# Patient Record
Sex: Female | Born: 1985
Health system: Southern US, Community
[De-identification: ages and names within clinical notes are randomized; demographics above are authoritative.]

## PROBLEM LIST (undated history)

## (undated) DIAGNOSIS — A749 Chlamydial infection, unspecified: Secondary | ICD-10-CM

## (undated) DIAGNOSIS — Z789 Other specified health status: Secondary | ICD-10-CM

## (undated) DIAGNOSIS — O98819 Other maternal infectious and parasitic diseases complicating pregnancy, unspecified trimester: Secondary | ICD-10-CM

## (undated) HISTORY — PX: HERNIA REPAIR: SHX51

## (undated) HISTORY — DX: Other specified health status: Z78.9

---

## 2019-12-27 NOTE — L&D Delivery Note (Addendum)
Krystal Figueroa is a 34 y.o. s/p vaginal delivery at [redacted]w[redacted]d.  She was admitted for IOL secondary to newly diagnosed gHTN and several late decelerations on fetal heart tracing in MAU.    ROM: 19h 65m with clear fluid GBS Status: negative Maximum Maternal Temperature: 102.79F   Labor Progress: Pt in latent labor on admission.  She continued to progress well.  AROM was performed for clear fluid. Pitocin was also initiated, and pt then was noted to have complete cervical dilation. She then delivered. Prior to delivery, patient noted to have temperature of 102.3; ampicillin and gentamycin were administered for empiric treatment of chorioamnionitis prior to delivery.   Delivery Date/Time: 12/5/2021at 0254 Delivery: Called to room and patient was complete and pushing. Given deep prolonged decelerations with contractions, pt was consented for vacuum-assisted vaginal delivery, but she was able to delivery without assistance prior to time that vacuum extractor was made available. Head delivered LOA. Tight nuchal cord x1 present. Shoulder and body delivered in usual fashion. Infant with spontaneous cry, placed on mother's abdomen, dried and stimulated. Cord clamped x 2 immediately, and cut by delivering physician given initial fetal assessment. Cord blood drawn along with arterial blood gas (pH 7.17). Placenta delivered spontaneously with gentle cord traction. Fundus firm with massage and Pitocin. Labia, perineum, vagina, and cervix were inspected; 2nd degree laceration visualized and repaired. Infant was prompted placed on maternal chest s/p brief period of nursing evaluation.    Placenta: intact, 3-vessel cord, sent to L&D, blood gas obtained Complications: chorioamnionitis  Lacerations: 2nd degree laceration with repair  EBL: 100 ml Analgesia: epidural   Infant: female  APGARs 7 & 9  weight 3147g  Randa Ngo, MD OB Fellow, Faculty Practice 11/29/2020 10:48 AM

## 2020-06-18 ENCOUNTER — Other Ambulatory Visit: Payer: Self-pay

## 2020-06-18 ENCOUNTER — Other Ambulatory Visit: Payer: Self-pay | Admitting: Nurse Practitioner

## 2020-06-18 DIAGNOSIS — Z36 Encounter for antenatal screening for chromosomal anomalies: Secondary | ICD-10-CM

## 2020-06-18 LAB — OB RESULTS CONSOLE HEPATITIS B SURFACE ANTIGEN: Hepatitis B Surface Ag: NEGATIVE

## 2020-06-18 LAB — OB RESULTS CONSOLE RPR: RPR: NONREACTIVE

## 2020-06-18 LAB — OB RESULTS CONSOLE RUBELLA ANTIBODY, IGM: Rubella: IMMUNE

## 2020-06-18 LAB — OB RESULTS CONSOLE GC/CHLAMYDIA
Chlamydia: POSITIVE
Gonorrhea: NEGATIVE

## 2020-06-18 LAB — HEPATITIS C ANTIBODY: HCV Ab: NEGATIVE

## 2020-06-18 LAB — OB RESULTS CONSOLE HIV ANTIBODY (ROUTINE TESTING): HIV: NONREACTIVE

## 2020-07-02 ENCOUNTER — Ambulatory Visit: Payer: Self-pay | Attending: Obstetrics and Gynecology

## 2020-07-02 ENCOUNTER — Ambulatory Visit: Payer: Self-pay | Admitting: *Deleted

## 2020-07-02 ENCOUNTER — Other Ambulatory Visit: Payer: Self-pay

## 2020-07-02 ENCOUNTER — Other Ambulatory Visit: Payer: Self-pay | Admitting: *Deleted

## 2020-07-02 ENCOUNTER — Ambulatory Visit: Payer: Self-pay | Admitting: Genetic Counselor

## 2020-07-02 ENCOUNTER — Ambulatory Visit (HOSPITAL_BASED_OUTPATIENT_CLINIC_OR_DEPARTMENT_OTHER): Payer: Self-pay | Admitting: Genetic Counselor

## 2020-07-02 VITALS — BP 113/73 | HR 93 | Wt 153.0 lb

## 2020-07-02 DIAGNOSIS — Z36 Encounter for antenatal screening for chromosomal anomalies: Secondary | ICD-10-CM | POA: Insufficient documentation

## 2020-07-02 DIAGNOSIS — Z3A19 19 weeks gestation of pregnancy: Secondary | ICD-10-CM

## 2020-07-02 DIAGNOSIS — Z362 Encounter for other antenatal screening follow-up: Secondary | ICD-10-CM

## 2020-07-02 DIAGNOSIS — Z820 Family history of epilepsy and other diseases of the nervous system: Secondary | ICD-10-CM

## 2020-07-02 DIAGNOSIS — Z315 Encounter for genetic counseling: Secondary | ICD-10-CM

## 2020-07-02 DIAGNOSIS — O99212 Obesity complicating pregnancy, second trimester: Secondary | ICD-10-CM

## 2020-07-02 DIAGNOSIS — E669 Obesity, unspecified: Secondary | ICD-10-CM

## 2020-07-02 DIAGNOSIS — Z363 Encounter for antenatal screening for malformations: Secondary | ICD-10-CM

## 2020-07-02 DIAGNOSIS — O0990 Supervision of high risk pregnancy, unspecified, unspecified trimester: Secondary | ICD-10-CM

## 2020-07-02 DIAGNOSIS — Z8279 Family history of other congenital malformations, deformations and chromosomal abnormalities: Secondary | ICD-10-CM

## 2020-07-02 NOTE — Progress Notes (Addendum)
07/02/2020  Krystal Figueroa 08/27/1986 MRN: [id number] DOV: 07/02/2020  [name] presented to the [facility] for a genetics consultation regarding her family history of a neurodevelopmental disorder. [name] presented to her appointment alone. This session was facilitated by [facility].   Indication for genetic counseling - Family history of neurodevelopmental disorder  Prenatal history  [name] is a G3P0, 34 y.o. female. Her current pregnancy has completed [redacted]w[redacted]d (Estimated Date of Delivery: 11/26/20).  [name] denied exposure to environmental toxins or chemical agents. She denied the use of alcohol, tobacco or street drugs. She reported taking prenatal vitamins. She denied significant viral illnesses, fevers, and bleeding during the course of her pregnancy. Her medical and surgical histories were noncontributory.  Family History  A three generation pedigree was drafted and reviewed. The family history is remarkable for the following:  - [name] has a 20 year old sister who she describes as "special". She is able to walk/run independently at home and can speak but is difficult to understand. She was never able to attend school and lives at home with [name]'s parents. She also has not had a menstrual period in quite some time. She does not have any other medical problems. [name] reported that her sister looks like the rest of her family aside from having one pupil that is smaller than the other. When asked about what she believes caused her sister's condition, [name] reported that her mother had gastritis during pregnancy and took some unknown medications that made her feel unwell. Additionally, [name] informed me that her sister was born at the time of an eclipse, and that they believe this contributed to her features.  - Ms.  Krystal Figueroa has a maternal uncle who had a son who died at age 82. He also had a neurodevelopmental disorder of some kind; however, he was never able to walk, talk, or feed himself. [name] reported that he died from "fluid in his blood." She did not have any further details about him. She informed me that this cousin was born around the same time as her sister.  The remaining family histories were reviewed and found to be noncontributory for birth defects, intellectual disability, recurrent pregnancy loss, and known genetic conditions.    The patient's ethnicity is [place]. The father of the pregnancy's ethnicity is [place]. Ashkenazi Jewish ancestry and consanguinity were denied. Pedigree will be scanned under Media.  Discussion  Family history of neurodevelopmental disorder:  [name] was referred for genetic counseling due to her family history of an unknown neurodevelopmental disorder. Both [name]'s sister and maternal first cousin have a history of neurodevelopmental deficits. Neither of them have undergone genetic testing. [name]'s family believes that their features are related to an eclipse that was occurring at the time of birth for both individuals.   We discussed that developmental delays can be an underlying feature of many different genetic conditions with varying patterns of inheritance. However, developmental disorders can also be multifactorial in nature, occurring due to a combination of genetic and environmental factors that can be difficult to identify. A possible pattern of inheritance is unclear based on [name]'s family history. Since the etiology of her relatives' developmental disorders is unknown, risk assessment is limited. However, [name] understands that her children could be at increased risk for a developmental  disorder given her family history.   Aneuploidy screening:  We reviewed  noninvasive prenatal screening (NIPS) as an available screening option for chromosomal aneuploidies such as Down syndrome, trisomy 39, and trisomy 64. We discussed that these conditions are likely not related to her family history, but can occur sporadically due to nondisjunction. We briefly reviewed features of these conditions as well as NIPS technology. Specifically, we discussed that NIPS analyzes cell free DNA originating from the placenta that is found in the maternal blood circulation during pregnancy. This test is not diagnostic for chromosome conditions, but can provide information regarding the presence or absence of extra fetal DNA for chromosomes 13, 18, 21, and the sex chromosomes. Thus, it would not identify or rule out all fetal aneuploidy. The reported detection rate is 91-99% for trisomies 21, 18, 13, and sex chromosome aneuploidies. The false positive rate is reported to be less than 0.1% for any of these conditions. [name] indicated that she is interested in undergoing NIPS.  Ultrasound:  A complete ultrasound was performed today prior to our visit. The ultrasound report will be sent under separate cover. There were no visualized fetal anomalies or markers suggestive of aneuploidy.  Diagnostic testing:  [name] was also counseled regarding diagnostic testing via amniocentesis. We discussed the technical aspects of the procedure and quoted up to a 1 in 500 (0.2%) risk for spontaneous pregnancy loss or other adverse pregnancy outcomes as a result of amniocentesis. Cultured cells from an amniocentesis sample allow for the visualization of a fetal karyotype, which can detect >99% of chromosomal aberrations. Chromosomal microarray can also be performed to identify smaller deletions or duplications of fetal chromosomal material. An amniocentesis would not be able to determine if [name]'s fetus is affected by the same condition as her sister given that the  cause of her sister's condition remains unknown. After careful consideration, [name] declined amniocentesis at this time. She understands that amniocentesis is available at any point after 16 weeks of pregnancy and that she may opt to undergo the procedure at a later date should she change her mind.  Plan:  [name] had her blood drawn for MaterniT21 NIPS today. Results will take approximately one week to be returned. I will call her when results become available. She also agreed to send me a copy of her Medicaid card once she receives it so that I can forward it to the lab.  I counseled [name] regarding the above risks and available options. Second year [employer] genetic counseling student Krystal Figueroa participated in portions of this session under my supervision. The approximate face-to-face time with the genetic counselor was 45 minutes.  In summary:  Reviewed family history concerns  Sister and maternal first cousin with neurodevelopmental disorder of unclear etiology  Patient's children could be at increased risk for similar developmental delays given family history  Precise risk assessment is limited as possible pattern of inheritance is unclear  Reviewed results of ultrasound  No fetal anomalies or markers seen  Reduction in risk for fetal aneuploidy  Offered additional testing and screening  Opted to undergo MaterniT21 NIPS. We will follow results  Declined amniocentesis   Buelah Manis, MS, [facility] Genetic Counselor

## 2020-07-09 LAB — MATERNIT21 PLUS CORE+SCA
Fetal Fraction: 11
Monosomy X (Turner Syndrome): NOT DETECTED
Result (T21): NEGATIVE
Trisomy 13 (Patau syndrome): NEGATIVE
Trisomy 18 (Edwards syndrome): NEGATIVE
Trisomy 21 (Down syndrome): NEGATIVE
XXX (Triple X Syndrome): NOT DETECTED
XXY (Klinefelter Syndrome): NOT DETECTED
XYY (Jacobs Syndrome): NOT DETECTED

## 2020-07-10 ENCOUNTER — Telehealth: Payer: Self-pay | Admitting: Genetic Counselor

## 2020-07-10 NOTE — Telephone Encounter (Signed)
LVM for Ms. Arletha Pili re: good news about screening results. Requested a call back to my direct line to discuss these in more detail, as no identifiers were provided in voicemail message.   Buelah Manis, MS, [facility] Genetic Counselor

## 2020-07-23 ENCOUNTER — Telehealth: Payer: Self-pay | Admitting: Genetic Counselor

## 2020-07-23 NOTE — Telephone Encounter (Signed)
I called Ms. Arletha Pili with the help of a [employer], ID# [phone], to discuss her negative noninvasive prenatal screening (NIPS)/cell free DNA (cfDNA) testing result. Specifically, Ms. Arletha Pili had MaterniT21 testing through [facility]. These negative results demonstrated an expected representation of chromosome 7, 33, 71, and sex chromosome material, greatly reducing the likelihood of trisomies 52, 10, or 61 and sex chromosome aneuploidies for the pregnancy.   NIPS analyzes placental (fetal) DNA in maternal circulation. NIPS is considered to be highly specific and sensitive, but is not considered to be a diagnostic test. We reviewed that this testing identifies 91-99% of pregnancies with trisomies 43, 73, and 94, as well as sex chromosome abnormalities, but does not test for all genetic conditions. Diagnostic testing via amniocentesis is available should she be interested in confirming this result. Ms. Arletha Pili confirmed that she had no questions about these results at this time.  Buelah Manis, MS, [facility] Genetic Counselor

## 2020-07-29 ENCOUNTER — Ambulatory Visit: Payer: Self-pay | Admitting: *Deleted

## 2020-07-29 ENCOUNTER — Ambulatory Visit: Payer: Self-pay | Attending: Obstetrics and Gynecology

## 2020-07-29 ENCOUNTER — Encounter: Payer: Self-pay | Admitting: *Deleted

## 2020-07-29 ENCOUNTER — Other Ambulatory Visit: Payer: Self-pay

## 2020-07-29 VITALS — BP 108/70 | HR 93 | Ht 60.5 in | Wt 156.0 lb

## 2020-07-29 DIAGNOSIS — Z8279 Family history of other congenital malformations, deformations and chromosomal abnormalities: Secondary | ICD-10-CM

## 2020-07-29 DIAGNOSIS — O99212 Obesity complicating pregnancy, second trimester: Secondary | ICD-10-CM

## 2020-07-29 DIAGNOSIS — Z363 Encounter for antenatal screening for malformations: Secondary | ICD-10-CM

## 2020-07-29 DIAGNOSIS — E669 Obesity, unspecified: Secondary | ICD-10-CM

## 2020-07-29 DIAGNOSIS — Z362 Encounter for other antenatal screening follow-up: Secondary | ICD-10-CM | POA: Insufficient documentation

## 2020-07-29 DIAGNOSIS — Z3A22 22 weeks gestation of pregnancy: Secondary | ICD-10-CM

## 2020-09-16 LAB — OB RESULTS CONSOLE GC/CHLAMYDIA: Chlamydia: NEGATIVE

## 2020-10-30 LAB — OB RESULTS CONSOLE GC/CHLAMYDIA
Chlamydia: NEGATIVE
Gonorrhea: NEGATIVE

## 2020-10-30 LAB — OB RESULTS CONSOLE GBS: GBS: NEGATIVE

## 2020-11-27 ENCOUNTER — Encounter (HOSPITAL_COMMUNITY): Payer: Self-pay | Admitting: Obstetrics and Gynecology

## 2020-11-27 ENCOUNTER — Inpatient Hospital Stay (HOSPITAL_COMMUNITY)
Admission: AD | Admit: 2020-11-27 | Discharge: 2020-12-01 | DRG: 805 | Disposition: A | Discharge status: Home / Self Care | Payer: Medicaid Other | Attending: Obstetrics and Gynecology | Admitting: Obstetrics and Gynecology

## 2020-11-27 ENCOUNTER — Other Ambulatory Visit: Payer: Self-pay

## 2020-11-27 DIAGNOSIS — O411230 Chorioamnionitis, third trimester, not applicable or unspecified: Secondary | ICD-10-CM | POA: Diagnosis present

## 2020-11-27 DIAGNOSIS — Z3A40 40 weeks gestation of pregnancy: Secondary | ICD-10-CM

## 2020-11-27 DIAGNOSIS — O139 Gestational [pregnancy-induced] hypertension without significant proteinuria, unspecified trimester: Secondary | ICD-10-CM

## 2020-11-27 DIAGNOSIS — O471 False labor at or after 37 completed weeks of gestation: Secondary | ICD-10-CM

## 2020-11-27 DIAGNOSIS — O0990 Supervision of high risk pregnancy, unspecified, unspecified trimester: Secondary | ICD-10-CM

## 2020-11-27 DIAGNOSIS — Z20822 Contact with and (suspected) exposure to covid-19: Secondary | ICD-10-CM | POA: Diagnosis present

## 2020-11-27 DIAGNOSIS — O411290 Chorioamnionitis, unspecified trimester, not applicable or unspecified: Secondary | ICD-10-CM

## 2020-11-27 DIAGNOSIS — O134 Gestational [pregnancy-induced] hypertension without significant proteinuria, complicating childbirth: Principal | ICD-10-CM | POA: Diagnosis present

## 2020-11-27 HISTORY — DX: Chlamydial infection, unspecified: A74.9

## 2020-11-27 HISTORY — DX: Chlamydial infection, unspecified: O98.819

## 2020-11-27 LAB — POCT FERN TEST: POCT Fern Test: NEGATIVE

## 2020-11-27 NOTE — Progress Notes (Deleted)
S: [name] is a 34 y.o. G1P0 at [redacted]w[redacted]d  who presents to MAU today for labor evaluation.     Cervical exam by RN:  Dilation: 1.5 Effacement (%): 20 Cervical Position: Middle Station: -3 Presentation: Vertex Exam by:: Wilhemena Durie RN  Fetal Monitoring: Baseline: 140 Variability: moderate Accelerations: 15x15 Decelerations: none Contractions: intermittent  MDM Discussed patient with RN. NST reviewed and reactive. Monitored for 1 hour and SVE unchanged. Appointment next week with HD.   A: SIUP at [redacted]w[redacted]d  False labor  P: Discharge home Labor precautions and kick counts included in AVS Patient to follow-up with [facility] next week as scheduled  Patient may return to MAU as needed or when in labor   Layla Barter, MD 11/27/2020 11:21 PM  I reviewed NST with resident and agree with documentation as noted above.  Randa Ngo, MD OB Fellow, Faculty Practice 11/27/2020 11:28 PM

## 2020-11-27 NOTE — MAU Note (Signed)
Pt came in because she is 40w and had some lower abdominal pains on and off today. Not consistent. Had some leaking fluid earlier but hasn't continued. Has some bloody show. DFM earlier but baby moving well now.

## 2020-11-27 NOTE — MAU Provider Note (Signed)
First Provider Initiated Contact with Patient 11/27/20 2120     S: Krystal Figueroa is a 34 y.o. G1P0 at [redacted]w[redacted]d  who presents to MAU today complaining of leaking of fluid on one occasion early this afternoon. She denies vaginal bleeding. She endorses contractions. She reports normal fetal movement.    O: BP 135/85 (BP Location: Right Arm)   Pulse 91   Temp 98.4 F (36.9 C) (Oral)   Resp 20   Ht 4\' 11"  (1.499 m)   Wt 85.8 kg   LMP 02/20/2020   BMI 38.19 kg/m  GENERAL: Well-developed, well-nourished female in no acute distress.  HEAD: Normocephalic, atraumatic.  CHEST: Normal effort of breathing, regular heart rate ABDOMEN: Soft, nontender, gravid PELVIC: Normal external female genitalia. Vagina is pink and rugated. Cervix with normal contour, no lesions. Normal discharge.  Negative pooling.   Cervical exam: 1.5/20/-3    Fetal Monitoring: Baseline: 140 Variability: Mod Accelerations: 15 x 15 Decelerations: None Contractions: Irregular q 2-4  Results for orders placed or performed during the hospital encounter of 11/27/20 (from the past 24 hour(s))  POCT fern test     Status: Normal   Collection Time: 11/27/20  9:18 PM  Result Value Ref Range   POCT Fern Test Negative = intact amniotic membranes      A: SIUP at [redacted]w[redacted]d  Membranes intact  Negative pooling negative fern  P: Continue surveillance as RN Labor Check   Mallie Snooks, MSN, CNM Certified Nurse Midwife, [employer] for [employer], [place] [facility] 11/27/20 10:15 PM

## 2020-11-28 ENCOUNTER — Inpatient Hospital Stay (HOSPITAL_COMMUNITY): Payer: Medicaid Other | Admitting: Anesthesiology

## 2020-11-28 ENCOUNTER — Other Ambulatory Visit: Payer: Self-pay

## 2020-11-28 DIAGNOSIS — O135 Gestational [pregnancy-induced] hypertension without significant proteinuria, complicating the puerperium: Secondary | ICD-10-CM | POA: Diagnosis not present

## 2020-11-28 DIAGNOSIS — O134 Gestational [pregnancy-induced] hypertension without significant proteinuria, complicating childbirth: Secondary | ICD-10-CM | POA: Diagnosis present

## 2020-11-28 DIAGNOSIS — Z20822 Contact with and (suspected) exposure to covid-19: Secondary | ICD-10-CM | POA: Diagnosis present

## 2020-11-28 DIAGNOSIS — Z8759 Personal history of other complications of pregnancy, childbirth and the puerperium: Secondary | ICD-10-CM | POA: Diagnosis not present

## 2020-11-28 DIAGNOSIS — O0990 Supervision of high risk pregnancy, unspecified, unspecified trimester: Secondary | ICD-10-CM

## 2020-11-28 DIAGNOSIS — Z3A40 40 weeks gestation of pregnancy: Secondary | ICD-10-CM | POA: Diagnosis not present

## 2020-11-28 DIAGNOSIS — O133 Gestational [pregnancy-induced] hypertension without significant proteinuria, third trimester: Secondary | ICD-10-CM | POA: Diagnosis not present

## 2020-11-28 DIAGNOSIS — O411230 Chorioamnionitis, third trimester, not applicable or unspecified: Secondary | ICD-10-CM | POA: Diagnosis present

## 2020-11-28 HISTORY — DX: Supervision of high risk pregnancy, unspecified, unspecified trimester: O09.90

## 2020-11-28 LAB — COMPREHENSIVE METABOLIC PANEL
ALT: 21 U/L (ref 0–44)
AST: 32 U/L (ref 15–41)
Albumin: 2.9 g/dL — ABNORMAL LOW (ref 3.5–5.0)
Alkaline Phosphatase: 170 U/L — ABNORMAL HIGH (ref 38–126)
Anion gap: 10 (ref 5–15)
BUN: 5 mg/dL — ABNORMAL LOW (ref 6–20)
CO2: 19 mmol/L — ABNORMAL LOW (ref 22–32)
Calcium: 9 mg/dL (ref 8.9–10.3)
Chloride: 110 mmol/L (ref 98–111)
Creatinine, Ser: 0.5 mg/dL (ref 0.44–1.00)
GFR, Estimated: >60 mL/min (ref >60)
Glucose, Bld: 99 mg/dL (ref 70–99)
Potassium: 3.8 mmol/L (ref 3.5–5.1)
Sodium: 139 mmol/L (ref 135–145)
Total Bilirubin: 0.4 mg/dL (ref 0.3–1.2)
Total Protein: 6.1 g/dL — ABNORMAL LOW (ref 6.5–8.1)

## 2020-11-28 LAB — HIV ANTIBODY (ROUTINE TESTING W REFLEX): HIV Screen 4th Generation wRfx: NONREACTIVE

## 2020-11-28 LAB — CBC
HCT: 35.9 % — ABNORMAL LOW (ref 36.0–46.0)
HCT: 36.1 % (ref 36.0–46.0)
Hemoglobin: 12.2 g/dL (ref 12.0–15.0)
Hemoglobin: 12.7 g/dL (ref 12.0–15.0)
MCH: 29.5 pg (ref 26.0–34.0)
MCH: 30.2 pg (ref 26.0–34.0)
MCHC: 33.8 g/dL (ref 30.0–36.0)
MCHC: 35.4 g/dL (ref 30.0–36.0)
MCV: 85.5 fL (ref 80.0–100.0)
MCV: 87.4 fL (ref 80.0–100.0)
Platelets: 282 10*3/uL (ref 150–400)
Platelets: 292 10*3/uL (ref 150–400)
RBC: 4.13 MIL/uL (ref 3.87–5.11)
RBC: 4.2 MIL/uL (ref 3.87–5.11)
RDW: 14.7 % (ref 11.5–15.5)
RDW: 14.7 % (ref 11.5–15.5)
WBC: 17 10*3/uL — ABNORMAL HIGH (ref 4.0–10.5)
WBC: 8.8 10*3/uL (ref 4.0–10.5)
nRBC: 0 % (ref 0.0–0.2)
nRBC: 0.2 % (ref 0.0–0.2)

## 2020-11-28 LAB — RESP PANEL BY RT-PCR (FLU A&B, COVID) ARPGX2
Influenza A by PCR: NEGATIVE
Influenza B by PCR: NEGATIVE
SARS Coronavirus 2 by RT PCR: NEGATIVE

## 2020-11-28 LAB — PROTEIN / CREATININE RATIO, URINE
Creatinine, Urine: 44.08 mg/dL
Protein Creatinine Ratio: 0.25 mg/mg{Cre} — ABNORMAL HIGH (ref 0.00–0.15)
Total Protein, Urine: 11 mg/dL

## 2020-11-28 LAB — TYPE AND SCREEN
ABO/RH(D): O POS
Antibody Screen: NEGATIVE

## 2020-11-28 LAB — RPR: RPR Ser Ql: NONREACTIVE

## 2020-11-28 MED ORDER — DIPHENHYDRAMINE HCL 50 MG/ML IJ SOLN
12.5000 mg | INTRAMUSCULAR | Status: DC | PRN
  Filled 2020-11-28: qty 1

## 2020-11-28 MED ORDER — EPHEDRINE 5 MG/ML INJ: 10.0000 mg | INTRAVENOUS | Status: DC | PRN

## 2020-11-28 MED ORDER — FENTANYL CITRATE (PF) 100 MCG/2ML IJ SOLN
100.0000 ug | INTRAMUSCULAR | Status: DC | PRN
  Administered 2020-11-28: 100 ug via INTRAVENOUS
  Filled 2020-11-28: qty 2

## 2020-11-28 MED ORDER — LACTATED RINGERS IV SOLN: 500.0000 mL | Freq: Once | INTRAVENOUS | Status: DC

## 2020-11-28 MED ORDER — PHENYLEPHRINE 40 MCG/ML (10ML) SYRINGE FOR IV PUSH (FOR BLOOD PRESSURE SUPPORT): 80.0000 ug | PREFILLED_SYRINGE | INTRAVENOUS | Status: DC | PRN

## 2020-11-28 MED ORDER — OXYTOCIN-SODIUM CHLORIDE 30-0.9 UT/500ML-% IV SOLN
1.0000 m[IU]/min | INTRAVENOUS | Status: DC
  Administered 2020-11-28: 1 m[IU]/min via INTRAVENOUS
  Administered 2020-11-28: 2 m[IU]/min via INTRAVENOUS

## 2020-11-28 MED ORDER — TERBUTALINE SULFATE 1 MG/ML IJ SOLN
INTRAMUSCULAR | Status: AC
  Filled 2020-11-28: qty 1

## 2020-11-28 MED ORDER — SODIUM CHLORIDE (PF) 0.9 % IJ SOLN
INTRAMUSCULAR | Status: DC | PRN
  Administered 2020-11-28: 12 mL/h via EPIDURAL

## 2020-11-28 MED ORDER — OXYCODONE-ACETAMINOPHEN 5-325 MG PO TABS: 2.0000 | ORAL_TABLET | ORAL | Status: DC | PRN

## 2020-11-28 MED ORDER — LACTATED RINGERS IV SOLN: INTRAVENOUS | Status: DC

## 2020-11-28 MED ORDER — PHENYLEPHRINE 40 MCG/ML (10ML) SYRINGE FOR IV PUSH (FOR BLOOD PRESSURE SUPPORT)
80.0000 ug | PREFILLED_SYRINGE | INTRAVENOUS | Status: DC | PRN
  Filled 2020-11-28: qty 10

## 2020-11-28 MED ORDER — FENTANYL-BUPIVACAINE-NACL 0.5-0.125-0.9 MG/250ML-% EP SOLN
12.0000 mL/h | EPIDURAL | Status: DC | PRN
  Filled 2020-11-28: qty 250

## 2020-11-28 MED ORDER — DIPHENHYDRAMINE HCL 50 MG/ML IJ SOLN
25.0000 mg | Freq: Once | INTRAMUSCULAR | Status: AC
  Administered 2020-11-28: 25 mg via INTRAVENOUS

## 2020-11-28 MED ORDER — SODIUM CHLORIDE 0.9 % IV SOLN
2.0000 g | Freq: Four times a day (QID) | INTRAVENOUS | Status: DC
  Administered 2020-11-28: 2 g via INTRAVENOUS
  Filled 2020-11-28: qty 2000

## 2020-11-28 MED ORDER — OXYTOCIN BOLUS FROM INFUSION: 333.0000 mL | Freq: Once | INTRAVENOUS | Status: DC

## 2020-11-28 MED ORDER — ONDANSETRON HCL 4 MG/2ML IJ SOLN
4.0000 mg | Freq: Four times a day (QID) | INTRAMUSCULAR | Status: DC | PRN
  Administered 2020-11-28: 4 mg via INTRAVENOUS
  Filled 2020-11-28: qty 2

## 2020-11-28 MED ORDER — OXYTOCIN-SODIUM CHLORIDE 30-0.9 UT/500ML-% IV SOLN
2.5000 [IU]/h | INTRAVENOUS | Status: DC
  Filled 2020-11-28: qty 500

## 2020-11-28 MED ORDER — OXYCODONE-ACETAMINOPHEN 5-325 MG PO TABS: 1.0000 | ORAL_TABLET | ORAL | Status: DC | PRN

## 2020-11-28 MED ORDER — LACTATED RINGERS IV SOLN
500.0000 mL | INTRAVENOUS | Status: DC | PRN
  Administered 2020-11-28: 500 mL via INTRAVENOUS

## 2020-11-28 MED ORDER — TERBUTALINE SULFATE 1 MG/ML IJ SOLN
0.2500 mg | Freq: Once | INTRAMUSCULAR | Status: AC | PRN
  Administered 2020-11-28: 0.25 mg via SUBCUTANEOUS

## 2020-11-28 MED ORDER — LIDOCAINE HCL (PF) 1 % IJ SOLN
INTRAMUSCULAR | Status: DC | PRN
  Administered 2020-11-28: 5 mL via EPIDURAL
  Administered 2020-11-28: 2 mL via EPIDURAL
  Administered 2020-11-28: 3 mL via EPIDURAL

## 2020-11-28 MED ORDER — ACETAMINOPHEN 325 MG PO TABS
650.0000 mg | ORAL_TABLET | ORAL | Status: DC | PRN
  Administered 2020-11-29: 650 mg via ORAL
  Filled 2020-11-28: qty 2

## 2020-11-28 MED ORDER — SOD CITRATE-CITRIC ACID 500-334 MG/5ML PO SOLN: 30.0000 mL | ORAL | Status: DC | PRN

## 2020-11-28 MED ORDER — LIDOCAINE HCL (PF) 1 % IJ SOLN: 30.0000 mL | INTRAMUSCULAR | Status: DC | PRN

## 2020-11-28 MED ORDER — GENTAMICIN SULFATE 40 MG/ML IJ SOLN
280.0000 mg | Freq: Once | INTRAVENOUS | Status: AC
  Administered 2020-11-29: 280 mg via INTRAVENOUS
  Filled 2020-11-28: qty 7

## 2020-11-28 NOTE — Progress Notes (Signed)
Krystal Figueroa is a 34 y.o. G1P0 at [redacted]w[redacted]d.  Subjective: More uncomfortable w. UC's. Reports getting more frequent.    Objective: BP (!) 141/92   Pulse 89   Temp 98 F (36.7 C)   Resp 18   Ht 4\' 11"  (1.499 m)   Wt 85.8 kg   LMP 02/20/2020   SpO2 100%   BMI 38.19 kg/m    FHT:  FHR: 140 bpm, variability: mod,  accelerations: 15x15,  decelerations:  Mild variables UC:   Irreg, mild-mod Dilation: 6 Effacement (%): 70 Cervical Position: Anterior Station: -2 Presentation: Vertex Exam by:: Jasmen Emrich,cnm  AROM scant fluid from forebag  Labs: NA  Assessment / Plan: [redacted]w[redacted]d week IUP Labor: IOL w/ protracted latent phase, but had stopped pitocin due to decels which have resolved. Fetal Wellbeing:  Category I Pain Control:  Comfort measures Anticipated MOD:  SVD Will start pitocin back at 1 and proceed slowly.   Tamala Julian, [place], [place] 11/28/2020 1:18 PM

## 2020-11-28 NOTE — Progress Notes (Signed)
[place] interpreter used during and post-epidural teaching [facility] [phone]).

## 2020-11-28 NOTE — Progress Notes (Signed)
Krystal Figueroa is a 34 y.o. G1P0 at [redacted]w[redacted]d.  Called to [facility] for pt involuntarily pushing.  Subjective: Feeling fatigued   Objective: BP 129/77   Pulse (!) 103   Temp 98 F (36.7 C) (Oral)   Resp 19   Ht 4\' 11"  (1.499 m)   Wt 85.8 kg   LMP 02/20/2020   SpO2 98%   BMI 38.19 kg/m    FHT:  FHR: 135 bpm, variability: mod,  accelerations:  15x15,  decelerations:  2 minute decel, improved w repositioning  UC:   Q 2-3 minutes Dilation: 7 Effacement (%): 80 except for extremely swollen anterior lip.  Cervical Position: Anterior Station: -1 Presentation: Vertex, moderate caput  Exam by:: Jackquline Branca   Labs: CBC     Status: Abnormal   Collection Time: 11/28/20  2:26 PM  Result Value Ref Range   WBC 17.0 (H) 4.0 - 10.5 K/uL   RBC 4.20 3.87 - 5.11 MIL/uL   Hemoglobin 12.7 12.0 - 15.0 g/dL   HCT 35.9 (L) 36 - 46 %   MCV 85.5 80.0 - 100.0 fL   MCH 30.2 26.0 - 34.0 pg   MCHC 35.4 30.0 - 36.0 g/dL   RDW 14.7 11.5 - 15.5 %   Platelets 292 150 - 400 K/uL   nRBC 0.0 0.0 - 0.2 %    Assessment / Plan: [redacted]w[redacted]d week IUP Induction of Labor for gHTN: s/p FB, pitocin started at 7 but quickly turned off 2/2 prolonged deceleration. AROM 0700. Pit restarted at 1300, have not been able to uptitrate due to fetal intolerance. IUPC placed at this time. Will continue to uptitrate as able.    Gestational HTN: PEC labs nl. Normotensive since epidural.   Fetal Wellbeing:  Category I Pain Control:  epidural Anticipated MOD:  SVD     Krystal Berlin, MD 11/28/2020

## 2020-11-28 NOTE — Progress Notes (Addendum)
Labor Progress Note Krystal Figueroa is a 34 y.o. G1P0 at [redacted]w[redacted]d presented for   S: Patient doing well. No complaints or concerns at this time.   O:  BP 123/78   Pulse 86   Temp 98.5 F (36.9 C) (Oral)   Resp 19   Ht 4\' 11"  (1.499 m)   Wt 85.8 kg   LMP 02/20/2020   SpO2 100%   BMI 38.19 kg/m  EFM: 130/moderate variability/+accels, no decels.   CVE: 5/70/0  A&P:34 y.o. G1P0 at [redacted]w[redacted]d here for IOL secondary to newly diagnosed gHTN and decelerations on FHT in MAU.  #Labor: Progressing well. FB out at 0650. Will start pitocin and titrate to complete. Will consider AROM at next check.  #Pain: epidural per pt request #FWB: cat 1 #GBS negative  #gHTN: Newly diagnosed in MAU with multiple mild range BPs. UP:C 0.25 but otherwise preeclampsia labs wnl. No concerning signs/symptoms.BP since arrival to the floor has been normotensive.  #H/o Chlamydia in pregnancy: s/p negative TOC #Family H/O Neurologic Deficits at Birth: pt's sister; peds aware but reassuringly pt had normal genetic screening and normal detailed anatomy scan.   Layla Barter, MD 7:06 AM

## 2020-11-28 NOTE — Progress Notes (Signed)
Called out for assistance to help get pt on hands and knees. Dr Astrid Drafts at bedside placed FSE to help trace infant; Day shift nurse at bedside, gave report and another gave terbutaline sq. Infant FHR increased and back to baseline.

## 2020-11-28 NOTE — Anesthesia Preprocedure Evaluation (Signed)
Anesthesia Evaluation  Patient identified by MRN, date of birth, ID band Patient awake    Reviewed: Allergy & Precautions, Patient's Chart, lab work & pertinent test results  Airway Mallampati: II  TM Distance: >3 FB     Dental   Pulmonary neg pulmonary ROS,    Pulmonary exam normal        Cardiovascular hypertension, Normal cardiovascular exam     Neuro/Psych negative neurological ROS     GI/Hepatic negative GI ROS, Neg liver ROS,   Endo/Other  negative endocrine ROS  Renal/GU negative Renal ROS     Musculoskeletal   Abdominal   Peds  Hematology negative hematology ROS (+)   Anesthesia Other Findings   Reproductive/Obstetrics (+) Pregnancy                             Lab Results  Component Value Date   WBC 17.0 (H) 11/28/2020   HGB 12.7 11/28/2020   HCT 35.9 (L) 11/28/2020   MCV 85.5 11/28/2020   PLT 292 11/28/2020    Anesthesia Physical Anesthesia Plan  ASA: II  Anesthesia Plan: Epidural   Post-op Pain Management:    Induction:   PONV Risk Score and Plan: Treatment may vary due to age or medical condition  Airway Management Planned: Natural Airway  Additional Equipment:   Intra-op Plan:   Post-operative Plan:   Informed Consent: I have reviewed the patients History and Physical, chart, labs and discussed the procedure including the risks, benefits and alternatives for the proposed anesthesia with the patient or authorized representative who has indicated his/her understanding and acceptance.       Plan Discussed with: CRNA  Anesthesia Plan Comments:         Anesthesia Quick Evaluation

## 2020-11-28 NOTE — Anesthesia Procedure Notes (Signed)
Epidural Patient location during procedure: OB Start time: 11/28/2020 3:17 PM End time: 11/28/2020 3:26 PM  Staffing Anesthesiologist: Suzette Battiest, MD Performed: anesthesiologist   Preanesthetic Checklist Completed: patient identified, IV checked, site marked, risks and benefits discussed, surgical consent, monitors and equipment checked, pre-op evaluation and timeout performed  Epidural Patient position: sitting Prep: DuraPrep and site prepped and draped Patient monitoring: continuous pulse ox and blood pressure Approach: midline Location: L4-L5 Injection technique: LOR air  Needle:  Needle type: Tuohy  Needle gauge: 17 G Needle length: 9 cm and 9 Needle insertion depth: 7 cm Catheter type: closed end flexible Catheter size: 19 Gauge Catheter at skin depth: 12 cm Test dose: negative  Assessment Events: blood not aspirated, injection not painful, no injection resistance, no paresthesia and negative IV test

## 2020-11-28 NOTE — H&P (Signed)
OBSTETRIC ADMISSION HISTORY AND PHYSICAL  Krystal Figueroa is a 34 y.o. female G1P0 with IUP at [redacted]w[redacted]d by L/19 presenting for IOL secondary to newly diagnosed gHTN and several late decelerations on FHT in MAU. She reports +FMs, No LOF, no VB, no blurry vision, headaches or peripheral edema, and RUQ pain.  She plans on breast and bottle feeding. She is undecided for birth control, but considering bills.  She received her prenatal care at [facility]: By L/19 --->  Estimated Date of Delivery: 11/26/20  Sono: @[redacted]w[redacted]d , CWD, normal anatomy, breech presentation, 264g, 40% EFW  Prenatal History/Complications:  - family history of infant with neurologic deficits and microphthalmia (pt's sister) - h/o chlamydia in pregnancy s/p negative TOC  Past Medical History: Past Medical History:  Diagnosis Date  . Chlamydia infection affecting pregnancy   . Medical history non-contributory     Past Surgical History: Past Surgical History:  Procedure Laterality Date  . HERNIA REPAIR      Obstetrical History: OB History    Gravida  1   Para      Term      Preterm      AB      Living        SAB      TAB      Ectopic      Multiple      Live Births              Social History Social History   Socioeconomic History  . Marital status: Single    Spouse name: Not on file  . Number of children: Not on file  . Years of education: Not on file  . Highest education level: Not on file  Occupational History  . Not on file  Tobacco Use  . Smoking status: Never Smoker  . Smokeless tobacco: Never Used  Vaping Use  . Vaping Use: Never used  Substance and Sexual Activity  . Alcohol use: Not Currently  . Drug use: Never  . Sexual activity: Not on file  Other Topics Concern  . Not on file  Social History Narrative  . Not on file   Social Determinants of Health   Financial Resource Strain:   . Difficulty of Paying Living Expenses: Not on file  Food Insecurity:   .  Worried About Charity fundraiser in the Last Year: Not on file  . Ran Out of Food in the Last Year: Not on file  Transportation Needs:   . Lack of Transportation (Medical): Not on file  . Lack of Transportation (Non-Medical): Not on file  Physical Activity:   . Days of Exercise per Week: Not on file  . Minutes of Exercise per Session: Not on file  Stress:   . Feeling of Stress : Not on file  Social Connections:   . Frequency of Communication with Friends and Family: Not on file  . Frequency of Social Gatherings with Friends and Family: Not on file  . Attends Religious Services: Not on file  . Active Member of Clubs or Organizations: Not on file  . Attends Archivist Meetings: Not on file  . Marital Status: Not on file    Family History: No family history on file.  Allergies: No Known Allergies  Medications Prior to Admission  Medication Sig Dispense Refill Last Dose  . Prenatal Vit-Fe Fumarate-FA (MULTIVITAMIN-PRENATAL) 27-0.8 MG TABS tablet Take 1 tablet by mouth daily at 12 noon.   11/27/2020 at  Unknown time     Review of Systems   All systems reviewed and negative except as stated in HPI  Blood pressure 138/86, pulse 76, temperature 98.4 F (36.9 C), temperature source Oral, resp. rate 20, height 4\' 11"  (1.499 m), weight 85.8 kg, last menstrual period 02/20/2020, SpO2 100 %. General appearance: alert, cooperative and appears stated age Lungs: normal WOB Heart: regular rate and rhythm Abdomen: soft, non-tender; bowel sounds normal Extremities: no sign of DVT Presentation: cephalic Fetal monitoringBaseline: 130 bpm, Variability: Good {> 6 bpm), Accelerations: Reactive and Decelerations: several late decelerations in MAU Uterine activityFrequency: Every 2-3 minutes Dilation: 1.5 Effacement (%): 20 Station: -3 Exam by:: Wilhemena Durie RN   Prenatal labs: ABO, Rh:  O+ Antibody:  negative Rubella:  immune RPR:  non-reactive HBsAg:   non-reactive HIV:    non-reactive GBS: Negative/-- (11/05 0000)  1 hr Glucola wnl Genetic screening wnl Anatomy US wnl  Prenatal Transfer Tool  Maternal Diabetes: No Genetic Screening: Normal (pt's sister born with neurologic deficits, microphthalmia) Maternal Ultrasounds/Referrals: Normal Fetal Ultrasounds or other Referrals: Genetics referral given family history as noted above. Maternal Substance Abuse:  No Significant Maternal Medications:  None Significant Maternal Lab Results: Group B Strep negative  Results for orders placed or performed during the hospital encounter of 11/27/20 (from the past 24 hour(s))  POCT fern test   Collection Time: 11/27/20  9:18 PM  Result Value Ref Range   POCT Fern Test Negative = intact amniotic membranes   Protein / creatinine ratio, urine   Collection Time: 11/27/20 11:42 PM  Result Value Ref Range   Creatinine, Urine 44.08 mg/dL   Total Protein, Urine 11 mg/dL   Protein Creatinine Ratio 0.25 (H) 0.00 - 0.15 mg/mg[Cre]  CBC   Collection Time: 11/28/20 12:26 AM  Result Value Ref Range   WBC 8.8 4.0 - 10.5 K/uL   RBC 4.13 3.87 - 5.11 MIL/uL   Hemoglobin 12.2 12.0 - 15.0 g/dL   HCT 36.1 36 - 46 %   MCV 87.4 80.0 - 100.0 fL   MCH 29.5 26.0 - 34.0 pg   MCHC 33.8 30.0 - 36.0 g/dL   RDW 14.7 11.5 - 15.5 %   Platelets 282 150 - 400 K/uL   nRBC 0.2 0.0 - 0.2 %  Comprehensive metabolic panel   Collection Time: 11/28/20 12:26 AM  Result Value Ref Range   Sodium 139 135 - 145 mmol/L   Potassium 3.8 3.5 - 5.1 mmol/L   Chloride 110 98 - 111 mmol/L   CO2 19 (L) 22 - 32 mmol/L   Glucose, Bld 99 70 - 99 mg/dL   BUN 5 (L) 6 - 20 mg/dL   Creatinine, Ser 0.50 0.44 - 1.00 mg/dL   Calcium 9.0 8.9 - 10.3 mg/dL   Total Protein 6.1 (L) 6.5 - 8.1 g/dL   Albumin 2.9 (L) 3.5 - 5.0 g/dL   AST 32 15 - 41 U/L   ALT 21 0 - 44 U/L   Alkaline Phosphatase 170 (H) 38 - 126 U/L   Total Bilirubin 0.4 0.3 - 1.2 mg/dL   GFR, Estimated >60 >60 mL/min   Anion gap 10 5 - 15     Patient Active Problem List   Diagnosis Date Noted  . Supervision of high-risk pregnancy 11/28/2020    Assessment/Plan:  Krystal Figueroa is a 34 y.o. G1P0 at [redacted]w[redacted]d here for IOL secondary to newly diagnosed gHTN and decelerations on FHT in MAU.  #Labor: Plan for FB placement  to facilitate cervical ripening, followed by pitocin. #Pain: per pt request #FWB: Category 2 strip given  #ID: GBS negative #MOF: breast and bottle #MOC: undecided (considering birth control pills) #Circ: no #gHTN: Newly diagnosed in MAU with multiple mild range BPs. UP:C 0.25 but otherwise preeclampsia labs wnl. No concerning signs/symptoms. Will monitor closely. #H/o Chlamydia in pregnancy: s/p negative TOC #Family H/O Neurologic Deficits at Birth: pt's sister; peds aware but reassuringly pt had normal genetic screening and normal detailed anatomy scan.  Randa Ngo, MD OB Fellow, Faculty Practice 11/28/2020 2:45 AM

## 2020-11-28 NOTE — Progress Notes (Signed)
Krystal Figueroa is a 34 y.o. G1P0 at [redacted]w[redacted]d.  Subjective: Mild discomfort w/ UC's  Objective: BP (!) 106/31   Pulse 97   Temp 98 F (36.7 C) (Oral)   Resp 19   Ht 4\' 11"  (1.499 m)   Wt 85.8 kg   LMP 02/20/2020   SpO2 100%   BMI 38.19 kg/m    FHT:  FHR: 140 bpm, variability: mod,  accelerations:  15x15,  decelerations:  none UC:   Q 3-5 minutes, mild-mod Dilation: 5.5 Effacement (%): 50 Cervical Position: Anterior Station: -2 Presentation: Vertex Exam by:: [place] CNM  Labs: Results for orders placed or performed during the hospital encounter of 11/27/20 (from the past 24 hour(s))  POCT fern test     Status: Normal  Protein / creatinine ratio, urine     Status: Abnormal   Collection Time: 11/27/20 11:42 PM  Result Value Ref Range   Creatinine, Urine 44.08 mg/dL   Total Protein, Urine 11 mg/dL   Protein Creatinine Ratio 0.25 (H) 0.00 - 0.15 mg/mg[Cre]  CBC     Status: None   Collection Time: 11/28/20 12:26 AM  Result Value Ref Range   WBC 8.8 4.0 - 10.5 K/uL   RBC 4.13 3.87 - 5.11 MIL/uL   Hemoglobin 12.2 12.0 - 15.0 g/dL   HCT 36.1 36 - 46 %   MCV 87.4 80.0 - 100.0 fL   MCH 29.5 26.0 - 34.0 pg   MCHC 33.8 30.0 - 36.0 g/dL   RDW 14.7 11.5 - 15.5 %   Platelets 282 150 - 400 K/uL   nRBC 0.2 0.0 - 0.2 %  Comprehensive metabolic panel     Status: Abnormal   Collection Time: 11/28/20 12:26 AM  Result Value Ref Range   Sodium 139 135 - 145 mmol/L   Potassium 3.8 3.5 - 5.1 mmol/L   Chloride 110 98 - 111 mmol/L   CO2 19 (L) 22 - 32 mmol/L   Glucose, Bld 99 70 - 99 mg/dL   BUN 5 (L) 6 - 20 mg/dL   Creatinine, Ser 0.50 0.44 - 1.00 mg/dL   Calcium 9.0 8.9 - 10.3 mg/dL   Total Protein 6.1 (L) 6.5 - 8.1 g/dL   Albumin 2.9 (L) 3.5 - 5.0 g/dL   AST 32 15 - 41 U/L   ALT 21 0 - 44 U/L   Alkaline Phosphatase 170 (H) 38 - 126 U/L   Total Bilirubin 0.4 0.3 - 1.2 mg/dL   GFR, Estimated >60 >60 mL/min   Anion gap 10 5 - 15  Resp Panel by RT-PCR (Flu A&B, Covid)  Nasopharyngeal Swab     Status: None   Collection Time: 11/28/20  1:22 AM   Specimen: Nasopharyngeal Swab; Nasopharyngeal(NP) swabs in vial transport medium  Result Value Ref Range   SARS Coronavirus 2 by RT PCR NEGATIVE NEGATIVE   Influenza A by PCR NEGATIVE NEGATIVE   Influenza B by PCR NEGATIVE NEGATIVE  HIV Antibody (routine testing w rflx)     Status: None   Collection Time: 11/28/20  1:23 AM  Result Value Ref Range   HIV Screen 4th Generation wRfx Non Reactive Non Reactive  Type and screen [place]     Status: None   Collection Time: 11/28/20  1:33 AM  Result Value Ref Range   ABO/RH(D) O POS    Antibody Screen NEG    Sample Expiration      12/01/2020,2359 Performed at [facility], [address], [place], [place] [address]     Assessment / Plan: [redacted]w[redacted]d week IUP Labor: Early, Made little progress since last exam. Will stick w Dr. Rudy Jew plan to expectantly manage for 4 hours. AROM'd at ~0705 w/ FSE placement, but not grossly ruptured Fetal Wellbeing:  Category I Pain Control:  Comfort measures.  Anticipated MOD:  SVD Consider AROM forebag at next check, pitocin PRN.   Tamala Julian, [place], [place] 11/28/2020 9:03 AM

## 2020-11-28 NOTE — Progress Notes (Signed)
Krystal Figueroa is a 34 y.o. G1P0 at [redacted]w[redacted]d.  Called to [facility] for pt involuntarily pushing.  Subjective: Reports pelvic pressure.   Video interpreter used.   Objective: BP (!) 159/92   Pulse 85   Temp 98 F (36.7 C)   Resp 18   Ht 4\' 11"  (1.499 m)   Wt 85.8 kg   LMP 02/20/2020   SpO2 100%   BMI 38.19 kg/m    FHT:  FHR: 135 bpm, variability: mod,  accelerations:  15x15,  decelerations:  none UC:   Q 2-3 minutes, strong Dilation: 8 Effacement (%): 80 except for extremely swollen anterior lip.  Cervical Position: Anterior Station: -1 Presentation: Vertex Exam by:: lee  Labs: CBC     Status: Abnormal   Collection Time: 11/28/20  2:26 PM  Result Value Ref Range   WBC 17.0 (H) 4.0 - 10.5 K/uL   RBC 4.20 3.87 - 5.11 MIL/uL   Hemoglobin 12.7 12.0 - 15.0 g/dL   HCT 35.9 (L) 36 - 46 %   MCV 85.5 80.0 - 100.0 fL   MCH 30.2 26.0 - 34.0 pg   MCHC 35.4 30.0 - 36.0 g/dL   RDW 14.7 11.5 - 15.5 %   Platelets 292 150 - 400 K/uL   nRBC 0.0 0.0 - 0.2 %    Assessment / Plan: 109w2d week IUP Labor: Transition Fetal Wellbeing:  Category I Pain Control:  Requesting epidural Anticipated MOD:  SVD Will proceed with epidural and give Benadryl to try to reduce urge to push and reduce swelling respectively.   Tamala Julian, [place], [place] 11/28/2020 2:53 PM

## 2020-11-29 ENCOUNTER — Encounter (HOSPITAL_COMMUNITY): Payer: Self-pay | Admitting: Obstetrics and Gynecology

## 2020-11-29 DIAGNOSIS — O133 Gestational [pregnancy-induced] hypertension without significant proteinuria, third trimester: Secondary | ICD-10-CM

## 2020-11-29 DIAGNOSIS — O139 Gestational [pregnancy-induced] hypertension without significant proteinuria, unspecified trimester: Secondary | ICD-10-CM

## 2020-11-29 DIAGNOSIS — O411290 Chorioamnionitis, unspecified trimester, not applicable or unspecified: Secondary | ICD-10-CM

## 2020-11-29 DIAGNOSIS — O411230 Chorioamnionitis, third trimester, not applicable or unspecified: Secondary | ICD-10-CM

## 2020-11-29 DIAGNOSIS — Z3A40 40 weeks gestation of pregnancy: Secondary | ICD-10-CM

## 2020-11-29 MED ORDER — ACETAMINOPHEN 325 MG PO TABS
650.0000 mg | ORAL_TABLET | ORAL | Status: DC | PRN
  Administered 2020-11-29: 650 mg via ORAL
  Filled 2020-11-29: qty 2

## 2020-11-29 MED ORDER — SIMETHICONE 80 MG PO CHEW: 80.0000 mg | CHEWABLE_TABLET | ORAL | Status: DC | PRN

## 2020-11-29 MED ORDER — MEASLES, MUMPS & RUBELLA VAC IJ SOLR: 0.5000 mL | Freq: Once | INTRAMUSCULAR | Status: DC

## 2020-11-29 MED ORDER — ONDANSETRON HCL 4 MG PO TABS: 4.0000 mg | ORAL_TABLET | ORAL | Status: DC | PRN

## 2020-11-29 MED ORDER — MAGNESIUM HYDROXIDE 400 MG/5ML PO SUSP: 30.0000 mL | ORAL | Status: DC | PRN

## 2020-11-29 MED ORDER — WITCH HAZEL-GLYCERIN EX PADS: 1.0000 "application " | MEDICATED_PAD | CUTANEOUS | Status: DC | PRN

## 2020-11-29 MED ORDER — PRENATAL MULTIVITAMIN CH
1.0000 | ORAL_TABLET | Freq: Every day | ORAL | Status: DC
  Administered 2020-11-29 – 2020-12-01 (×3): 1 via ORAL
  Filled 2020-11-29 (×3): qty 1

## 2020-11-29 MED ORDER — IBUPROFEN 600 MG PO TABS
600.0000 mg | ORAL_TABLET | Freq: Four times a day (QID) | ORAL | Status: DC
  Administered 2020-11-29 – 2020-12-01 (×10): 600 mg via ORAL
  Filled 2020-11-29 (×10): qty 1

## 2020-11-29 MED ORDER — DIBUCAINE (PERIANAL) 1 % EX OINT: 1.0000 "application " | TOPICAL_OINTMENT | CUTANEOUS | Status: DC | PRN

## 2020-11-29 MED ORDER — ONDANSETRON HCL 4 MG/2ML IJ SOLN: 4.0000 mg | INTRAMUSCULAR | Status: DC | PRN

## 2020-11-29 MED ORDER — BENZOCAINE-MENTHOL 20-0.5 % EX AERO: 1.0000 "application " | INHALATION_SPRAY | CUTANEOUS | Status: DC | PRN

## 2020-11-29 MED ORDER — SENNOSIDES-DOCUSATE SODIUM 8.6-50 MG PO TABS
2.0000 | ORAL_TABLET | ORAL | Status: DC
  Administered 2020-11-30 – 2020-12-01 (×2): 2 via ORAL
  Filled 2020-11-29 (×2): qty 2

## 2020-11-29 MED ORDER — COCONUT OIL OIL: 1.0000 "application " | TOPICAL_OIL | Status: DC | PRN

## 2020-11-29 MED ORDER — TETANUS-DIPHTH-ACELL PERTUSSIS 5-2.5-18.5 LF-MCG/0.5 IM SUSY: 0.5000 mL | PREFILLED_SYRINGE | Freq: Once | INTRAMUSCULAR | Status: DC

## 2020-11-29 MED ORDER — DIPHENHYDRAMINE HCL 25 MG PO CAPS: 25.0000 mg | ORAL_CAPSULE | Freq: Four times a day (QID) | ORAL | Status: DC | PRN

## 2020-11-29 NOTE — Lactation Note (Signed)
This note was copied from a baby's chart. Lactation Consultation Note  Patient Name: Krystal Figueroa [id number] Date: 11/29/2020 Reason for consult: Initial assessment;Term;Primapara;1st time breastfeeding   P1 mother whose infant is now 68 hours old.  This is a term baby at 40+3 weeks.  Mother's feeding preference is breast/bottle.  In house [place] interpreter, Lorn Junes, used for interpretation.  Mother was attempting to breast feed when I arrived.  Offered to assist with latching and mother agreeable.  Taught hand expression and one visible drop of colostrum noted and fed back to baby.  Worked on suck training with baby for a few minutes prior to latching.  Attempted to latch to the right breast in the cross cradle hold.  Baby latched but was not interested in sucking.  Breast compressions and gentle stimulation demonstrated, however, he was still not interested in sucking.  Placed him STS on mother's chest and he fell asleep.  Reviewed breast feeding basics.  Colostrum container provided and milk storage times reviewed.  Mother will call for latch assistance as needed.    Mom made aware of O/P services, breastfeeding support groups, community resources, and our phone # for post-discharge questions. Mother does not have a DEBP for home use but may contact [facility] if a pump is desired.  Father present.  Both parents receptive to teaching given.  RN updated.   Maternal Data Formula Feeding for Exclusion: Yes Reason for exclusion: Mother's choice to formula and breast feed on admission Has patient been taught Hand Expression?: Yes Does the patient have breastfeeding experience prior to this delivery?: No  Feeding Feeding Type: Breast Fed  LATCH Score Latch: Too sleepy or reluctant, no latch achieved, no sucking elicited.  Audible Swallowing: None  Type of Nipple: Everted at rest and after stimulation  Comfort (Breast/Nipple): Soft / non-tender  Hold (Positioning): Assistance  needed to correctly position infant at breast and maintain latch.  LATCH Score: 5  Interventions Interventions: Breast feeding basics reviewed;Assisted with latch;Skin to skin;Breast massage;Hand express;Breast compression;Adjust position;Position options;Support pillows  Lactation Tools Discussed/Used WIC Program: Yes   Consult Status Consult Status: Follow-up Date: 11/30/20 Follow-up type: In-patient    Nonie Lochner R Dhaval Woo 11/29/2020, 9:49 AM

## 2020-11-29 NOTE — Discharge Summary (Signed)
Postpartum Discharge Summary    Patient Name: Krystal Figueroa DOB: 1986/06/03 MRN: [id number]  Date of admission: 11/27/2020 Delivery date:11/29/2020  Delivering provider: Randa Ngo  Date of discharge: 12/01/2020  Admitting diagnosis: Supervision of high-risk pregnancy [O09.90] Intrauterine pregnancy: [redacted]w[redacted]d    Secondary diagnosis:  Principal Problem:   Vaginal delivery Active Problems:   Supervision of high-risk pregnancy   Chorioamnionitis   Gestational hypertension  Additional problems: as noted above Discharge diagnosis: Vaginal delivery                                Post partum procedures:none Augmentation: Pitocin and IP Foley and AROM Complications: chorioamnionitis (amp and gent prior to delivery)  Hospital course: Induction of Labor With Vaginal Delivery   34y.o. yo G1P1001 at 426w3das admitted to the hospital 11/27/2020 for induction of labor.  Indication for induction: Gestational hypertension.  Patient had an uncomplicated labor course as follows: Membrane Rupture Time/Date: 7:09 AM ,11/28/2020   Delivery Method:Vaginal, Spontaneous  Episiotomy:   Lacerations:  2nd degree  Details of delivery can be found in separate delivery note. Patient had a routine postpartum course. Patient is discharged home 12/01/20. Pt discharged on norvasc 72m91maily with plan for 1 week BP check in clinic.  Newborn Data: Birth date:11/29/2020  Birth time:2:54 AM  Gender:Female  Living status:Living  Apgars:7 ,9  Weight:3147 g   Magnesium Sulfate received: No BMZ received: No Rhophylac:N/A MMR:N/A T-DaP:Given prenatally Flu: Yes Transfusion:No  Physical exam  Vitals:   11/30/20 0605 11/30/20 1535 11/30/20 2231 12/01/20 0545  BP: 107/75 114/79 (!) 136/94 130/83  Pulse: 84 83 70 73  Resp: '18 20 18 18  ' Temp: 98 F (36.7 C) 98.6 F (37 C) 98.4 F (36.9 C) 97.8 F (36.6 C)  TempSrc: Oral Oral Oral Oral  SpO2: 98% 100%    Weight:      Height:       General:  alert, cooperative and no distress Lochia: appropriate Uterine Fundus: firm Incision: N/A DVT Evaluation: No evidence of DVT seen on physical exam. No cords or calf tenderness. No significant calf/ankle edema. Labs: Lab Results  Component Value Date   WBC 17.0 (H) 11/28/2020   HGB 12.7 11/28/2020   HCT 35.9 (L) 11/28/2020   MCV 85.5 11/28/2020   PLT 292 11/28/2020   CMP Latest Ref Rng & Units 11/28/2020  Glucose 70 - 99 mg/dL 99  BUN 6 - 20 mg/dL 5(L)  Creatinine 0.44 - 1.00 mg/dL 0.50  Sodium 135 - 145 mmol/L 139  Potassium 3.5 - 5.1 mmol/L 3.8  Chloride 98 - 111 mmol/L 110  CO2 22 - 32 mmol/L 19(L)  Calcium 8.9 - 10.3 mg/dL 9.0  Total Protein 6.5 - 8.1 g/dL 6.1(L)  Total Bilirubin 0.3 - 1.2 mg/dL 0.4  Alkaline Phos 38 - 126 U/L 170(H)  AST 15 - 41 U/L 32  ALT 0 - 44 U/L 21   Edinburgh Score: Edinburgh Postnatal Depression Scale Screening Tool 11/29/2020  I have been able to laugh and see the funny side of things. 0  I have looked forward with enjoyment to things. 0  I have blamed myself unnecessarily when things went wrong. 0  I have been anxious or worried for no good reason. 0  I have felt scared or panicky for no good reason. 0  Things have been getting on top of me. 0  I have been  so unhappy that I have had difficulty sleeping. 0  I have felt sad or miserable. 0  I have been so unhappy that I have been crying. 0  The thought of harming myself has occurred to me. 0  Edinburgh Postnatal Depression Scale Total 0     After visit meds:  Allergies as of 12/01/2020   No Known Allergies     Medication List    TAKE these medications   acetaminophen 325 MG tablet Commonly known as: Tylenol Take 2 tablets (650 mg total) by mouth every 6 (six) hours as needed for mild pain, moderate pain, fever or headache (for pain scale < 4).   amLODipine 5 MG tablet Commonly known as: NORVASC Take 1 tablet (5 mg total) by mouth daily.   coconut oil Oil Apply 1 application  topically as needed (nipple pain).   ibuprofen 800 MG tablet Commonly known as: ADVIL Take 1 tablet (800 mg total) by mouth every 8 (eight) hours as needed for moderate pain or cramping.   multivitamin-prenatal 27-0.8 MG Tabs tablet Take 1 tablet by mouth daily at 12 noon.   norethindrone 0.35 MG tablet Commonly known as: Ortho Micronor Take 1 tablet (0.35 mg total) by mouth daily.       Discharge home in stable condition Infant Feeding: breast + bottle Infant Disposition:home with mother Discharge instruction: per After Visit Summary and Postpartum booklet. Activity: Advance as tolerated. Pelvic rest for 6 weeks.  Diet: routine diet Future Appointments:No future appointments. Follow up Visit: Pt instructed to call [facility] to schedule PP appt & 1 week BP check.  Please schedule this patient for a In person postpartum visit in 4 weeks with the following provider: Any provider. Additional Postpartum F/U:BP check 1 week  High risk pregnancy complicated by: gestational HTN (diagnosed on admission to L&D) Delivery mode:  Vaginal, Spontaneous  Anticipated Birth Control:  POPs  Felisia Balcom, Gildardo Cranker, MD OB Fellow, Faculty Practice 12/01/2020 6:31 AM

## 2020-11-29 NOTE — Anesthesia Postprocedure Evaluation (Signed)
Anesthesia Post Note  Patient: Krystal Figueroa  Procedure(s) Performed: AN AD [place]     Patient location during evaluation: Mother Baby Anesthesia Type: Epidural Level of consciousness: awake and alert and oriented Pain management: satisfactory to patient Vital Signs Assessment: post-procedure vital signs reviewed and stable Respiratory status: spontaneous breathing and nonlabored ventilation Cardiovascular status: stable Postop Assessment: no headache, no backache, no signs of nausea or vomiting, adequate PO intake, patient able to bend at knees and able to ambulate (patient up walking) Anesthetic complications: no   No complications documented.  Last Vitals:  Vitals:   11/29/20 1402 11/29/20 1758  BP: 116/74 114/72  Pulse: 87 82  Resp: 18 18  Temp: 36.9 C 36.8 C  SpO2: 100% 100%    Last Pain:  Vitals:   11/29/20 1758  TempSrc: Oral  PainSc: 0-No pain   Pain Goal:                Epidural/Spinal Function Cutaneous sensation: Normal sensation (11/29/20 1758), Patient able to flex knees: Yes (11/29/20 1758), Patient able to lift hips off bed: Yes (11/29/20 1758), Back pain beyond tenderness at insertion site: No (11/29/20 1758), Progressively worsening motor and/or sensory loss: No (11/29/20 1758), Bowel and/or bladder incontinence post epidural: No (11/29/20 1758)  [name]

## 2020-11-29 NOTE — Discharge Instructions (Signed)

## 2020-11-30 ENCOUNTER — Encounter (HOSPITAL_COMMUNITY): Payer: Self-pay | Admitting: Obstetrics and Gynecology

## 2020-11-30 DIAGNOSIS — O135 Gestational [pregnancy-induced] hypertension without significant proteinuria, complicating the puerperium: Secondary | ICD-10-CM

## 2020-11-30 DIAGNOSIS — Z8759 Personal history of other complications of pregnancy, childbirth and the puerperium: Secondary | ICD-10-CM

## 2020-11-30 NOTE — Lactation Note (Signed)
This note was copied from a baby's chart. Lactation Consultation Note  Patient Name: Krystal Figueroa [id number] Date: 11/30/2020 Reason for consult: Follow-up assessment;Term;Primapara;1st time breastfeeding   P1 mother whose infant is now 78 hours old.  This is a term baby at 40+3 weeks.    In house Spanish interpreter not available at this time.  Initiated interpretation with [facility] interpreter [phone]).  However, ended up using three different interpreters due to interpreter losing service.  In depth education provided regarding breast feeding.  Mother has been giving many formula bottles because she "doesn't have any milk" and "baby was hungry."  Discussed milk coming to volume and how to best obtain a full milk supply.  Baby's bilirubin level was 8.3 mg/dl at 28 hours.  NP in room and will order a serum bilirubin level to be drawn at 1500 today.  Parents verbalized understanding.  Offered to assist with latching and mother agreeable.  Reviewed hand expression.  Mother was unable to obtain colostrum drops.  Assisted baby to latch to the left breast in the cross cradle hold.  It took multiple attempts for baby to latch and stay latched to the breast; used formula drops at the breast to get him to sustain a latch and continue sucking.  Observed him feeding for 10 minutes with much stimulation and formula drops.  An occasional swallow was noted.  After 10 minutes he became too sleepy to continue.  Asked father to provide the formula supplementation while I initiated the DEBP with mother.  Pump parts, assembly, disassembly and cleaning reviewed.  Observed mother pumping with the #24 flange size.  Halfway through the pumping I exited the room.  She did not have any colostrum at that time.   Encouraged mother to feed on cue and to continue supplementation due to jaundice level.  Mother will pump for 15 minutes after breast feeding.  She will feed back any EBM she obtains to baby.  RN in room to  provide medication and updated.  Mother will call her RN/LC for latch assistance as needed.  Mother does not have a DEBP for home use.  She is aware that she may call the [facility] office to discuss pump rental.  Father present and supportive.   Maternal Data Formula Feeding for Exclusion: Yes Reason for exclusion: Mother's choice to formula and breast feed on admission  Feeding Feeding Type: Breast Fed  LATCH Score Latch: Repeated attempts needed to sustain latch, nipple held in mouth throughout feeding, stimulation needed to elicit sucking reflex.  Audible Swallowing: A few with stimulation  Type of Nipple: Everted at rest and after stimulation  Comfort (Breast/Nipple): Soft / non-tender  Hold (Positioning): Assistance needed to correctly position infant at breast and maintain latch.  LATCH Score: 7  Interventions Interventions: Breast feeding basics reviewed;Assisted with latch;Skin to skin;Breast massage;Hand express;Breast compression;Adjust position;DEBP;Hand pump;Position options;Support pillows  Lactation Tools Discussed/Used     Consult Status Consult Status: Follow-up Date: 12/01/20 Follow-up type: In-patient    [place] Sarinana R Shanisha Lech 11/30/2020, 12:04 PM

## 2020-11-30 NOTE — Progress Notes (Signed)
POSTPARTUM PROGRESS NOTE  Subjective: Krystal Figueroa is a 34 y.o. G1P1001 s/p vaginal delivery at [redacted]w[redacted]d.  She reports she is doing well. No acute events overnight. She denies any problems with ambulating, voiding or po intake. Denies nausea or vomiting. She has passed flatus. Pain is well controlled.  Lochia is minimal.  Objective: Blood pressure 108/79, pulse 83, temperature 98 F (36.7 C), temperature source Oral, resp. rate 18, height 4\' 11"  (1.499 m), weight 85.8 kg, last menstrual period 02/20/2020, SpO2 100 %, unknown if currently breastfeeding.  Physical Exam:  General: alert, cooperative and no distress Chest: no respiratory distress Abdomen: soft, non-tender  Uterine Fundus: firm and at level of umbilicus Extremities: No calf swelling or tenderness  no edema  Recent Labs    11/28/20 0026 11/28/20 1426  HGB 12.2 12.7  HCT 36.1 35.9*    Assessment/Plan: Krystal Figueroa is a 34 y.o. G1P1001 s/p vaginal delivery at [redacted]w[redacted]d for IOL secondary to gHTN.  Routine Postpartum Care: Doing well, pain well-controlled.  -- Continue routine care, lactation support  -- Contraception: condoms -- Feeding: breast & bottle; lactation consult placed today per pt request -- Chorioamnionitis: s/p amp and gent prior to delivery. Last maternal fever 102.2 at [place] on 12/5 prior to delivery. -- gHTN: blood pressures low-normal range since delivery. Plan for 1 week BP check in clinic. Preeclampsia precautions discussed today.  Dispo: Plan for discharge on PPD#2 given baby needing to stay secondary to maternal chorioamnionitis diagnosed prior to delivery.  Randa Ngo, MD OB Fellow, Faculty Practice 11/30/2020 3:21 AM

## 2020-12-01 ENCOUNTER — Other Ambulatory Visit (HOSPITAL_COMMUNITY): Payer: Self-pay | Admitting: Obstetrics and Gynecology

## 2020-12-01 DIAGNOSIS — O135 Gestational [pregnancy-induced] hypertension without significant proteinuria, complicating the puerperium: Secondary | ICD-10-CM

## 2020-12-01 DIAGNOSIS — Z8759 Personal history of other complications of pregnancy, childbirth and the puerperium: Secondary | ICD-10-CM

## 2020-12-01 MED ORDER — NORETHINDRONE 0.35 MG PO TABS: 1.0000 | ORAL_TABLET | Freq: Every day | ORAL | 6 refills | Status: DC

## 2020-12-01 MED ORDER — AMLODIPINE BESYLATE 5 MG PO TABS
5.0000 mg | ORAL_TABLET | Freq: Every day | ORAL | 1 refills | Status: DC
Start: 2020-12-01 — End: 2020-12-01

## 2020-12-01 MED ORDER — ACETAMINOPHEN 325 MG PO TABS: 650.0000 mg | ORAL_TABLET | Freq: Four times a day (QID) | ORAL | Status: DC | PRN

## 2020-12-01 MED ORDER — AMLODIPINE BESYLATE 5 MG PO TABS
5.0000 mg | ORAL_TABLET | Freq: Every day | ORAL | Status: DC
  Administered 2020-12-01: 5 mg via ORAL
  Filled 2020-12-01: qty 1

## 2020-12-01 MED ORDER — IBUPROFEN 800 MG PO TABS: 800.0000 mg | ORAL_TABLET | Freq: Three times a day (TID) | ORAL | 0 refills | Status: DC | PRN

## 2020-12-01 MED ORDER — COCONUT OIL OIL
1.0000 | TOPICAL_OIL | 0 refills | Status: DC | PRN
Start: 2020-12-01 — End: 2022-03-14

## 2020-12-01 MED FILL — NORETHINDRONE 0.35 MG TAB: 0.35 | 28 days supply | Qty: 28 | Fill #0

## 2020-12-01 MED FILL — IBUPROFEN 800 MG TAB: 800 | 10 days supply | Qty: 30 | Fill #0

## 2020-12-01 MED FILL — AMLODIPINE BESYLATE 5 MG TA: 5 | 45 days supply | Qty: 45 | Fill #0

## 2020-12-01 NOTE — Lactation Note (Signed)
This note was copied from a baby's chart. Lactation Consultation Note  Patient Name: Krystal Figueroa JXBJY'N Date: 12/01/2020 Reason for consult: Follow-up assessment   Mother is a P38, infant is 39 hours old    Mother speaks Spanish < Interpreter on line. Verdis Frederickson # [phone] for all teaching. Mother reports that she is breast feeding but she doesn't know how much infant is getting. Lots of teaching on how to know that infant is getting milk.    Reviewed hand expression with mother. Observed large drops of colostrum. Mother was given a harmony hand pump with instructions. Mothers nipples are erect with compressible breast tissue. No observed trama of mothers nipples.   She is active with WIC . Mother has a DEBP sat up at the bedside.   We lost our interpreter and [phone] Eyvonne Mechanic came on line to do more teaching with [place]. Explained supply and demand to mother. Mother was concerned that she would not make enough milk.  Discussed treatment and prevention of engorgement.  Discussed using hand pump after each feeding and putting infant to the breast before each bottle of formula.   Mother to continue to cue base feed infant and feed at least 8-12 times or more in 24 hours and advised to allow for cluster feeding infant as needed.  Mother to continue to due STS. Mother is aware of available LC services at [facility], BFSG'S, OP Dept, and phone # for questions or concerns about breastfeeding.  Mother receptive to all teaching and plan of care.     Maternal Data    Feeding Feeding Type: Breast Fed  LATCH Score                   Interventions    Lactation Tools Discussed/Used     Consult Status      Krystal Figueroa 12/01/2020, 10:04 AM

## 2020-12-01 NOTE — Progress Notes (Signed)
Spoke to patient through interpreter line using [place] [phone]. Discussed payment for medications, patient stated she would be able to pay for medications. She also asked about health insurance for newborn. I educated to call health insurance, however will follow up with social work to clear any miscommunications for directions on getting newborn health insurance.

## 2020-12-01 NOTE — Social Work (Signed)
CSW met with MOB to provide assistance with information on applying for Medicaid. CSW used interpreter Lya #[phone]. MOB was pleasant and engaged. CSW observed FOB on couch holding newborn. CSW provided MOB with spanish epass information to apply for Medicaid, as well as information on DSS for assistance. MOB expressed no additional needs.   CSW identifies no further needs at this time.  Aphrodite Harpenau, LCSWA Clinical Social Work [facility]  [phone] 

## 2021-12-27 IMAGING — US US MFM OB DETAIL+14 WK
1 series · 13 of 28 positions shown · non-contrast
Comparison: none

[Series 1: us mfm ob detail+14 wk · 13 of 112 slices shown]
[im 5/112]
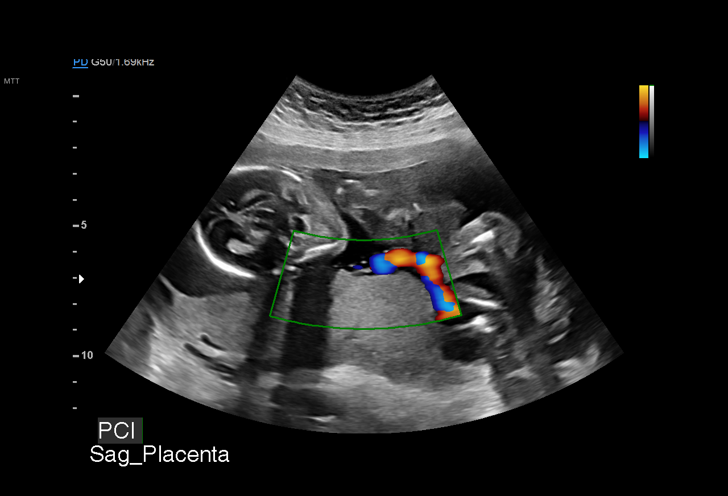
[im 13/112]
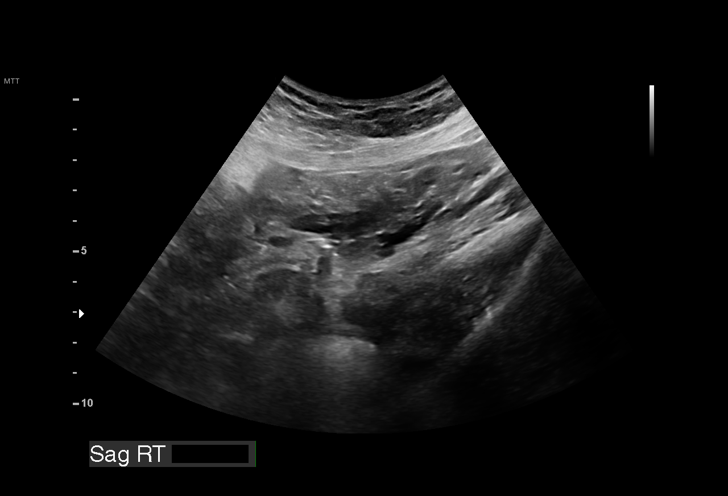
[im 21/112]
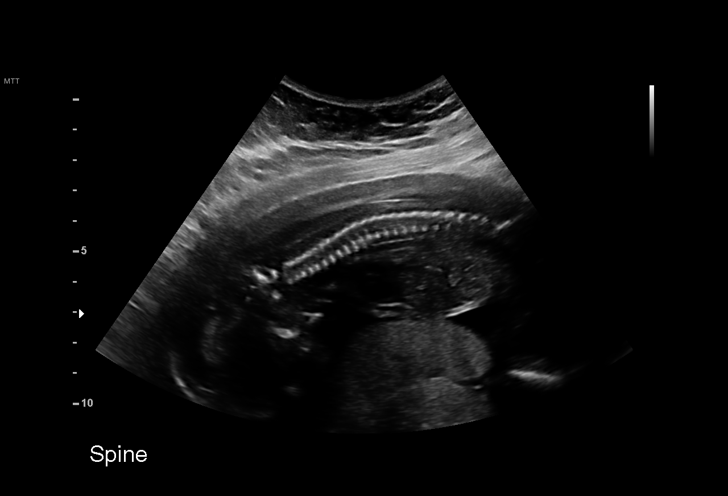
[im 29/112]
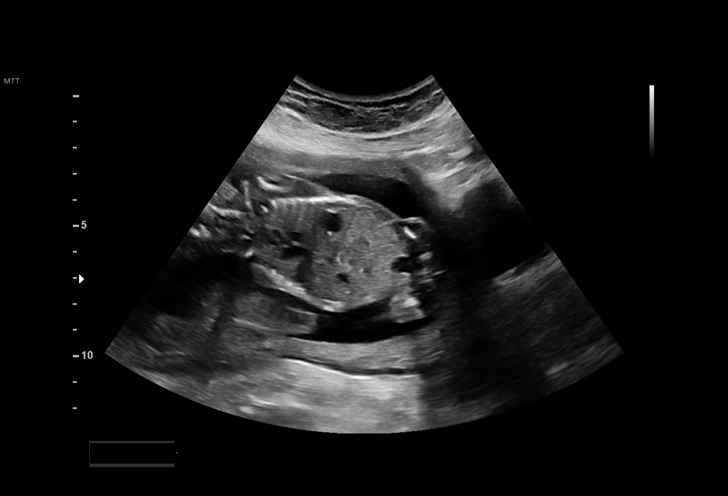
[im 38/112]
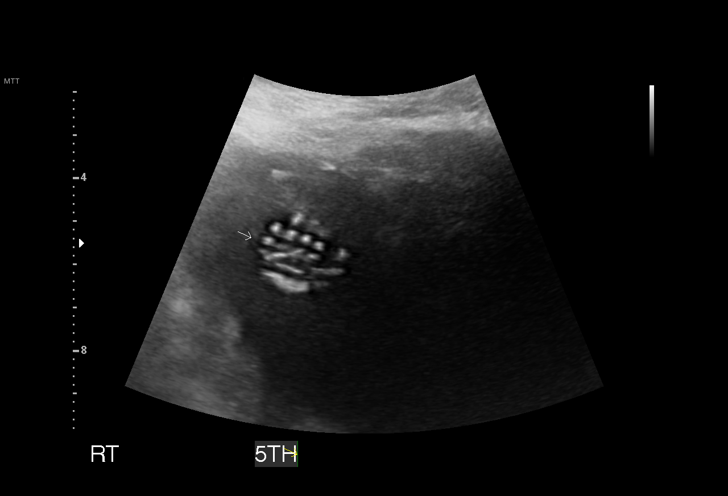
[im 46/112]
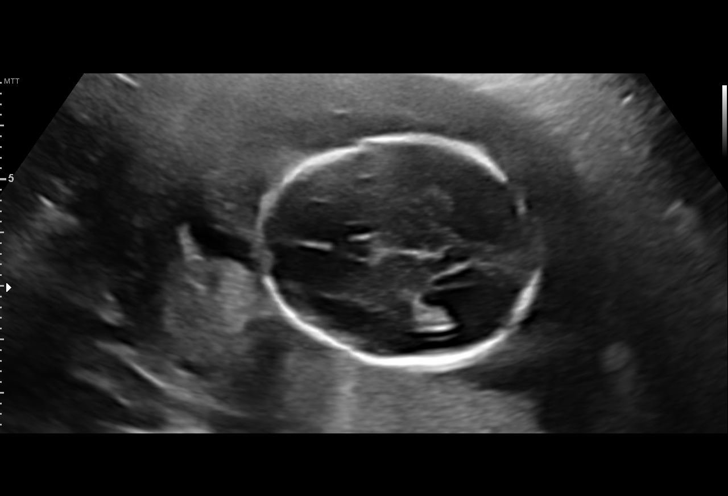
[im 58/112]
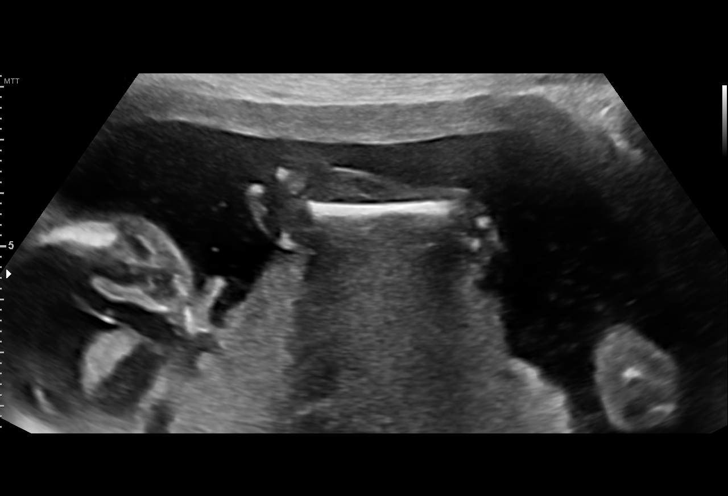
[im 66/112]
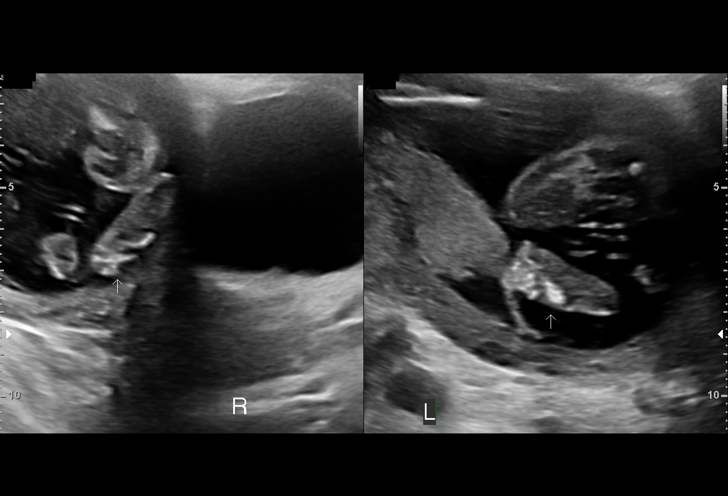
[im 75/112]
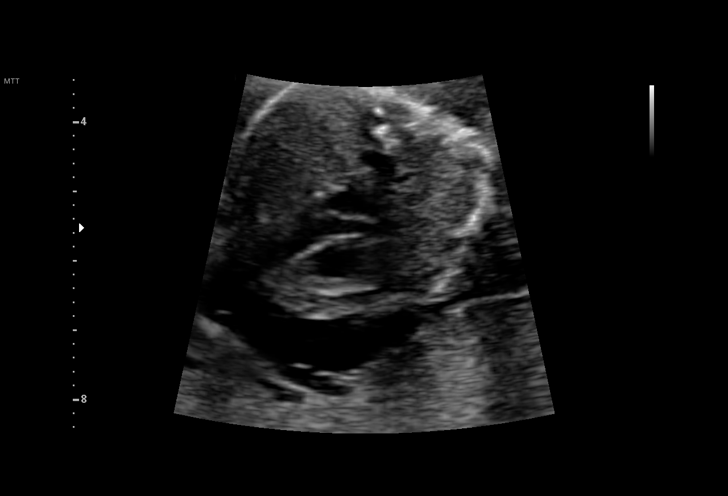
[im 83/112]
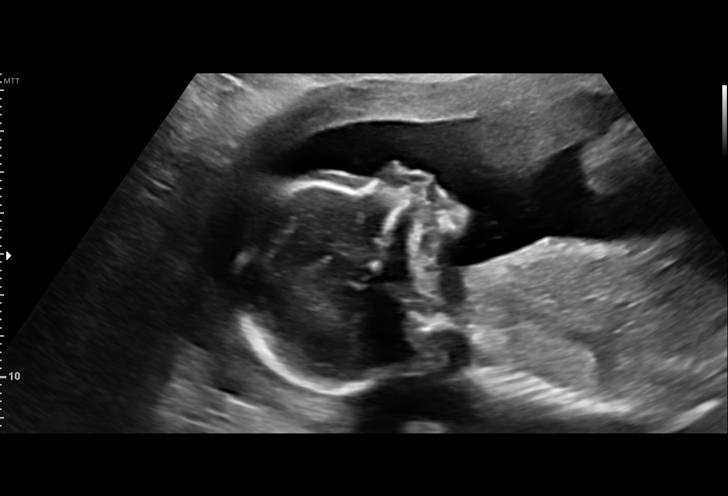
[im 91/112]
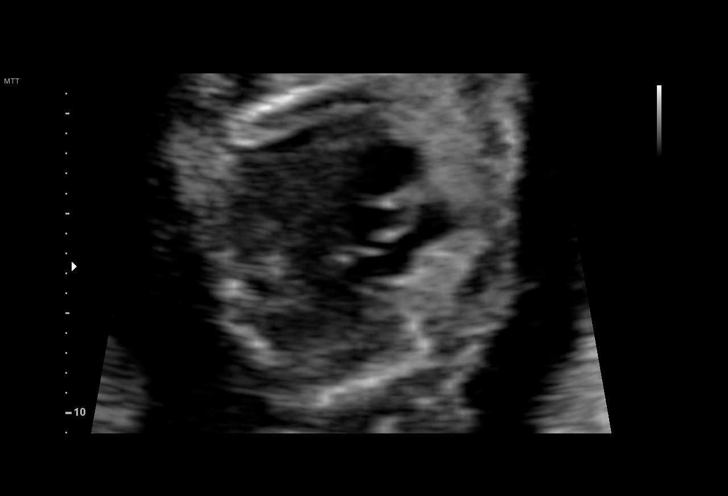
[im 99/112]
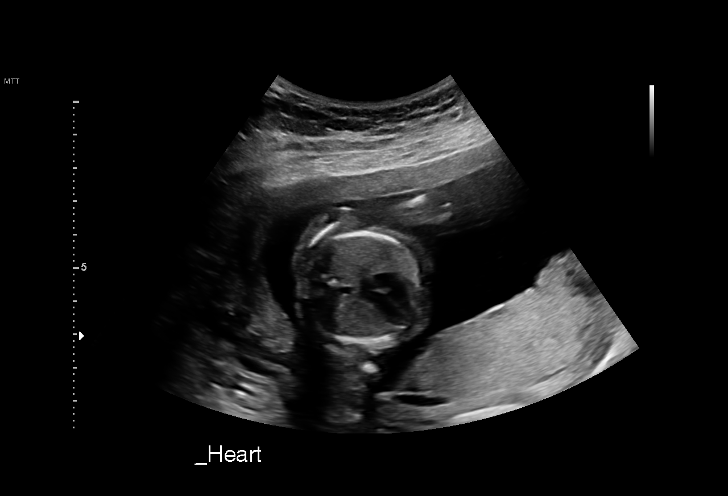
[im 107/112]
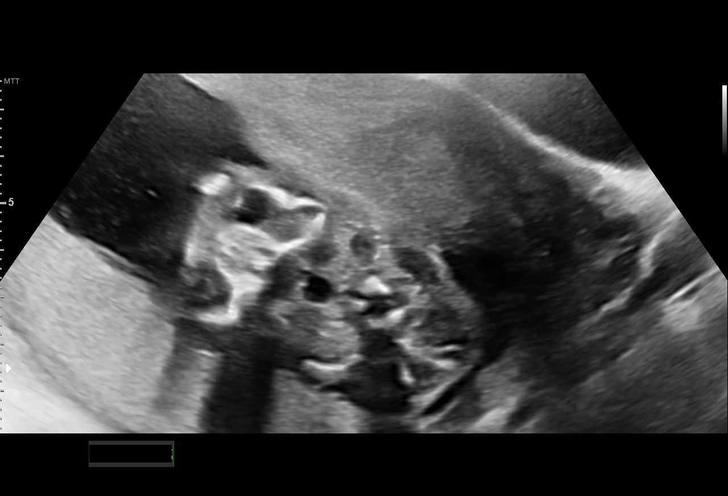

[13 of 28 positions shown; findings below may reference images not displayed]

ACSAI

 Attending:        Friskydon Bacchas        Secondary Phy.:   KAKI JIM
                                                            TARLA NP
                   [REDACTED]-                          [HOSPITAL]
                   Faculty Physician
                   [id number]

Indications

 19 weeks gestation of pregnancy
 Encounter for antenatal screening for
 malformations
 Family history of congenital anomaly
 Obesity complicating pregnancy, second
 trimester (BMI 30)
Fetal Evaluation

 Num Of Fetuses:         1
 Fetal Heart Rate(bpm):  149
 Cardiac Activity:       Observed
 Presentation:           Breech
 Placenta:               Posterior
 P. Cord Insertion:      Visualized, central

 Amniotic Fluid
 AFI FV:      Within normal limits

                             Largest Pocket(cm)

Biometry
 BPD:      41.9  mm     G. Age:  18w 5d         37  %    CI:        75.25   %    70 - 86
                                                         FL/HC:      17.8   %    16.1 -
 HC:      153.2  mm     G. Age:  18w 2d         14  %    HC/AC:      1.08        1.09 -
 AC:       142   mm     G. Age:  19w 4d         65  %    FL/BPD:     65.2   %
 FL:       27.3  mm     G. Age:  18w 2d         21  %    FL/AC:      19.2   %    20 - 24
 HUM:      26.2  mm     G. Age:  18w 1d         30  %
 CER:      19.1  mm     G. Age:  18w 5d         19  %
 NFT:       3.9  mm
 LV:        8.4  mm
 CM:        4.3  mm

 Est. FW:     264  gm      0 lb 9 oz     40  %
OB History

 Gravidity:    1
Gestational Age

 LMP:           19w 0d        Date:  02/20/20                 EDD:   11/26/20
 U/S Today:     18w 5d                                        EDD:   11/28/20
 Best:          19w 0d     Det. By:  LMP  (02/20/20)          EDD:   11/26/20
Anatomy

 Cranium:               Appears normal         Aortic Arch:            Appears normal
 Cavum:                 Appears normal         Ductal Arch:            Not well visualized
 Ventricles:            Appears normal         Diaphragm:              Appears normal
 Choroid Plexus:        Appears normal         Stomach:                Appears normal, left
                                                                       sided
 Cerebellum:            Appears normal         Abdomen:                Appears normal
 Posterior Fossa:       Appears normal         Abdominal Wall:         Appears nml (cord
                                                                       insert, abd wall)
 Nuchal Fold:           Appears normal         Cord Vessels:           Appears normal (3
                                                                       vessel cord)
 Face:                  Appears normal         Kidneys:                Appear normal
                        (orbits and profile)
 Lips:                  Appears normal         Bladder:                Appears normal
 Thoracic:              Appears normal         Spine:                  Appears normal
 Heart:                 Not well visualized    Upper Extremities:      Appears normal
 RVOT:                  Not well visualized    Lower Extremities:      Appears normal
 LVOT:                  Appears normal

 Other:  Fetus appears to be a male. Heels and 5th digit visualized. Nasal
         bone visualized. Open hands visualized. Technically difficult due to
         fetal position.
Cervix Uterus Adnexa

 Cervix
 Length:           3.06  cm.
 Normal appearance by transabdominal scan.

 Uterus
 No abnormality visualized.

 Right Ovary
 Not visualized. No adnexal mass visualized.
 Left Ovary
 Within normal limits. No adnexal mass visualized.

 Cul De Sac
 No free fluid seen.

 Adnexa
 No abnormality visualized.
Impression

 We performed fetal anatomy scan. No makers of
 aneuploidies or fetal structural defects are seen. Fetal
 biometry is consistent with her previously-established dates.
 Amniotic fluid is normal and good fetal activity is seen.
 Patient understands the limitations of ultrasound in detecting
 fetal anomalies.
 Patient has not had screening for fetal aneuploidies.

 She has a sister who has ?microphthalmia and delayed
 development. Patient met with our genetic counselor after
 ultrasound. You will be receiving a separate letter from her.
 After genetic counseling, the patient opted to have cell-free
 fetal DNA screening. Patient was sent over to the lab to have
 her blood drawn.
 We will communicate the results to the patient.
Recommendations

 -An appointment was made for her to return in 4 weeks for
 completion of fetal anatomy.
                 Canda, Hanele

## 2022-02-03 ENCOUNTER — Inpatient Hospital Stay (HOSPITAL_COMMUNITY): Payer: Self-pay

## 2022-02-03 ENCOUNTER — Encounter (HOSPITAL_COMMUNITY): Payer: Self-pay | Admitting: *Deleted

## 2022-02-03 ENCOUNTER — Inpatient Hospital Stay (HOSPITAL_COMMUNITY)
Admission: AD | Admit: 2022-02-03 | Discharge: 2022-02-03 | Disposition: A | Discharge status: Home / Self Care | Payer: Self-pay | Attending: Obstetrics and Gynecology | Admitting: Obstetrics and Gynecology

## 2022-02-03 DIAGNOSIS — O209 Hemorrhage in early pregnancy, unspecified: Secondary | ICD-10-CM

## 2022-02-03 DIAGNOSIS — D251 Intramural leiomyoma of uterus: Secondary | ICD-10-CM | POA: Insufficient documentation

## 2022-02-03 DIAGNOSIS — O200 Threatened abortion: Secondary | ICD-10-CM | POA: Insufficient documentation

## 2022-02-03 DIAGNOSIS — O4690 Antepartum hemorrhage, unspecified, unspecified trimester: Secondary | ICD-10-CM

## 2022-02-03 DIAGNOSIS — O3411 Maternal care for benign tumor of corpus uteri, first trimester: Secondary | ICD-10-CM | POA: Insufficient documentation

## 2022-02-03 DIAGNOSIS — Z79899 Other long term (current) drug therapy: Secondary | ICD-10-CM | POA: Insufficient documentation

## 2022-02-03 DIAGNOSIS — O3680X0 Pregnancy with inconclusive fetal viability, not applicable or unspecified: Secondary | ICD-10-CM

## 2022-02-03 DIAGNOSIS — Z3A09 9 weeks gestation of pregnancy: Secondary | ICD-10-CM | POA: Insufficient documentation

## 2022-02-03 LAB — CBC
HCT: 36.8 % (ref 36.0–46.0)
Hemoglobin: 12.4 g/dL (ref 12.0–15.0)
MCH: 29.2 pg (ref 26.0–34.0)
MCHC: 33.7 g/dL (ref 30.0–36.0)
MCV: 86.8 fL (ref 80.0–100.0)
Platelets: 327 10*3/uL (ref 150–400)
RBC: 4.24 MIL/uL (ref 3.87–5.11)
RDW: 13.2 % (ref 11.5–15.5)
WBC: 8.4 10*3/uL (ref 4.0–10.5)
nRBC: 0 % (ref 0.0–0.2)

## 2022-02-03 LAB — WET PREP, GENITAL
Sperm: NONE SEEN
Trich, Wet Prep: NONE SEEN
WBC, Wet Prep HPF POC: <10 (ref <10)
Yeast Wet Prep HPF POC: NONE SEEN

## 2022-02-03 LAB — HCG, QUANTITATIVE, PREGNANCY: hCG, Beta Chain, Quant, S: 7239 m[IU]/mL — ABNORMAL HIGH (ref <5)

## 2022-02-03 LAB — POC URINE PREG, ED: Preg Test, Ur: POSITIVE — AB

## 2022-02-03 NOTE — MAU Note (Signed)
Pt came over to MAU from Main ED. Started having spotting yesterday but bleeding heavier since 1400 today. Has used 4 pads since 1400 and has seen few small clots. Having some stabbing lower abd pain that is 3. LMP 11/29/21

## 2022-02-03 NOTE — ED Provider Notes (Signed)
Emergency Medicine Provider OB Triage Evaluation Note  Krystal Figueroa is a 36 y.o. female, G1P1001, at Unknown gestation who presents to the emergency department with complaints of vaginal bleeding onset yesterday, has used 4 pads today.  Also having some low back discomfort.  LMP 11/29/2021. No prenatal care (scheduled for later this month). Patent attorney used for assessment Review of  Systems  Positive: Vaginal bleeding, low back pain Negative: Abdominal pain  Physical Exam  BP 127/76 (BP Location: Right Arm)    Pulse 95    Temp 98.1 F (36.7 C) (Oral)    Resp 15    SpO2 100%  General: Awake, no distress  HEENT: Atraumatic  Resp: Normal effort  Cardiac: Normal rate Abd: Nondistended, nontender  MSK: Moves all extremities without difficulty Neuro: Speech clear  Medical Decision Making  Pt evaluated for pregnancy concern and is stable for transfer to MAU. Pt is in agreement with plan for transfer.  8:40 PM Discussed with MAU APP, who accepts patient in transfer.  Clinical Impression   1. Vaginal bleeding in pregnancy        Roque Lias 02/03/22 2040    Lucrezia Starch, MD 02/03/22 2312

## 2022-02-03 NOTE — MAU Provider Note (Signed)
Chief Complaint: Vaginal Bleeding   Event Date/Time   First Provider Initiated Contact with Patient 02/03/22 2352        SUBJECTIVE HPI: Krystal Figueroa is a 36 y.o. G2P1001 at [redacted]w[redacted]d by LMP who presents to maternity admissions reporting vaginal bleeding with passage of tissue which she thinks is the pregnancy.  Tissue collected for pathology. Has some intermittent pelvic pain. . She denies urinary symptoms, h/a, dizziness, n/v, or fever/chills.    Vaginal Bleeding The patient's primary symptoms include pelvic pain and vaginal bleeding. The patient's pertinent negatives include no genital itching, genital lesions or genital odor. This is a new problem. The current episode started today. The pain is mild. She is pregnant. Associated symptoms include abdominal pain. Pertinent negatives include no chills, constipation, diarrhea, fever, nausea or vomiting. The vaginal discharge was bloody. She has been passing clots. She has been passing tissue. She has tried nothing for the symptoms.  RN Note: Pt came over to MAU from Main ED. Started having spotting yesterday but bleeding heavier since 1400 today. Has used 4 pads since 1400 and has seen few small clots. Having some stabbing lower abd pain that is 3. LMP 11/29/21  Past Medical History:  Diagnosis Date   Chlamydia infection affecting pregnancy    Medical history non-contributory    Past Surgical History:  Procedure Laterality Date   HERNIA REPAIR     Social History   Socioeconomic History   Marital status: Single    Spouse name: Not on file   Number of children: Not on file   Years of education: Not on file   Highest education level: Not on file  Occupational History   Not on file  Tobacco Use   Smoking status: Never   Smokeless tobacco: Never  Vaping Use   Vaping Use: Never used  Substance and Sexual Activity   Alcohol use: Not Currently   Drug use: Never   Sexual activity: Not on file  Other Topics Concern   Not on file   Social History Narrative   Not on file   Social Determinants of Health   Financial Resource Strain: Not on file  Food Insecurity: Not on file  Transportation Needs: Not on file  Physical Activity: Not on file  Stress: Not on file  Social Connections: Not on file  Intimate Partner Violence: Not on file   No current facility-administered medications on file prior to encounter.   Current Outpatient Medications on File Prior to Encounter  Medication Sig Dispense Refill   acetaminophen (TYLENOL) 325 MG tablet Take 2 tablets (650 mg total) by mouth every 6 (six) hours as needed for mild pain, moderate pain, fever or headache (for pain scale < 4).     amLODipine (NORVASC) 5 MG tablet TAKE 1 TABLET (5 MG TOTAL) BY MOUTH DAILY. 45 tablet 1   coconut oil OIL Apply 1 application topically as needed (nipple pain).  0   norethindrone (MICRONOR) 0.35 MG tablet TAKE 1 TABLET (0.35 MG TOTAL) BY MOUTH DAILY. 56 tablet 6   Prenatal Vit-Fe Fumarate-FA (MULTIVITAMIN-PRENATAL) 27-0.8 MG TABS tablet Take 1 tablet by mouth daily at 12 noon.     No Known Allergies  I have reviewed patient's Past Medical Hx, Surgical Hx, Family Hx, Social Hx, medications and allergies.   ROS:  Review of Systems  Constitutional:  Negative for chills and fever.  Gastrointestinal:  Positive for abdominal pain. Negative for constipation, diarrhea, nausea and vomiting.  Genitourinary:  Positive for pelvic pain  and vaginal bleeding.  Review of Systems  Other systems negative   Physical Exam  Physical Exam Patient Vitals for the past 24 hrs:  BP Temp Temp src Pulse Resp SpO2 Height Weight  02/03/22 2123 112/71 98.3 F (36.8 C) -- 86 17 100 % 4\' 11"  (1.499 m) 77.1 kg  02/03/22 1945 127/76 98.1 F (36.7 C) Oral 95 15 100 % -- --   Constitutional: Well-developed, well-nourished female in no acute distress.  Cardiovascular: normal rate Respiratory: normal effort GI: Abd soft, non-tender.  MS: Extremities nontender, no  edema, normal ROM Neurologic: Alert and oriented x 4.  GU: Neg CVAT.  PELVIC EXAM: deferred in lieu of [place] (ordered from waiting room due to high acuity and census on MAU)  LAB RESULTS Results for orders placed or performed during the hospital encounter of 02/03/22 (from the past 24 hour(s))  POC urine preg, ED (not at [facility])     Status: Abnormal   Collection Time: 02/03/22  8:02 PM  Result Value Ref Range   Preg Test, Ur POSITIVE (A) NEGATIVE  CBC     Status: None   Collection Time: 02/03/22  9:06 PM  Result Value Ref Range   WBC 8.4 4.0 - 10.5 K/uL   RBC 4.24 3.87 - 5.11 MIL/uL   Hemoglobin 12.4 12.0 - 15.0 g/dL   HCT 36.8 36.0 - 46.0 %   MCV 86.8 80.0 - 100.0 fL   MCH 29.2 26.0 - 34.0 pg   MCHC 33.7 30.0 - 36.0 g/dL   RDW 13.2 11.5 - 15.5 %   Platelets 327 150 - 400 K/uL   nRBC 0.0 0.0 - 0.2 %  hCG, quantitative, pregnancy     Status: Abnormal   Collection Time: 02/03/22  9:06 PM  Result Value Ref Range   hCG, Beta Chain, Quant, S 7,239 (H) <5 mIU/mL  Wet prep, genital     Status: Abnormal   Collection Time: 02/03/22  9:45 PM   Specimen: Vaginal  Result Value Ref Range   Yeast Wet Prep HPF POC NONE SEEN NONE SEEN   Trich, Wet Prep NONE SEEN NONE SEEN   Clue Cells Wet Prep HPF POC PRESENT (A) NONE SEEN   WBC, Wet Prep HPF POC <10 <10   Sperm NONE SEEN        IMAGING US OB LESS THAN 14 WEEKS WITH OB TRANSVAGINAL  Result Date: 02/03/2022 CLINICAL DATA:  Pregnant, heavy vaginal bleeding.  LMP 11/29/2021 EXAM: OBSTETRIC <14 WK [place] AND TRANSVAGINAL OB US TECHNIQUE: Both transabdominal and transvaginal ultrasound examinations were performed for complete evaluation of the gestation as well as the maternal uterus, adnexal regions, and pelvic cul-de-sac. Transvaginal technique was performed to assess early pregnancy. COMPARISON:  None. FINDINGS: Intrauterine gestational sac: None identified Maternal uterus/adnexae: The uterus is anteverted. Cervix unremarkable. A heterogeneously  hypoechoic 2.2 cm mass is seen within the posterior fundus most in keeping with an intramural fibroid. The endometriuml is thickened measuring up to 23 mm in diameter, but as noted above, no intrauterine gestational sac is identified. There is no free fluid seen within the cul-de-sac. The maternal ovaries are unremarkable. IMPRESSION: No intrauterine gestational sac identified. Endometrial thickening may be the residua of failed pregnancy. Electronically Signed   By: Fidela Salisbury M.D.   On: 02/03/2022 22:58    MAU Management/MDM: Ordered usual first trimester r/o ectopic labs.   Pelvic cultures done Will check baseline Ultrasound to rule out ectopic.  This bleeding/pain can represent a normal pregnancy  with bleeding, spontaneous abortion or even an ectopic which can be life-threatening.  The process as listed above helps to determine which of these is present.  Reviewed [place] results with patient using interpretor.  Discussed the passage of tissue is suggestive of miscarriage, but we would recommend repeating the HCG in 2 days.to verify that this was a miscarriage.   ASSESSMENT 1. Vaginal bleeding in pregnancy   2. Vaginal bleeding affecting early pregnancy   3.     Threatened abortion  PLAN Discharge home Plan to repeat HCG level in 48 hours in MAU    Will repeat  Ultrasound in about 7-10 days if HCG levels double appropriately  Ectopic precautions  Pt stable at time of discharge. Encouraged to return here if she develops worsening of symptoms, increase in pain, fever, or other concerning symptoms.    Hansel Feinstein CNM, MSN Certified Nurse-Midwife 02/03/2022  11:52 PM

## 2022-02-03 NOTE — ED Triage Notes (Signed)
Pt reported to ED with c/o vaginal bleeding since this evening. Reports being pregnant but has not had prenatal care. LMP was in December.

## 2022-02-03 NOTE — MAU Note (Addendum)
Pt's 84mo old baby is in the car crying and pt is breastfeeding. She was instructed in obtaining vag swabs (interpreter helping) which she did without difficulty. She is going to go feed the baby and then will return. When pt collected vag swab pt showed the RN her pad which had ? Tissue on it. Specimen placed in a specimen cup and Carmelia Roller CNM aware and pathology ordered.

## 2022-02-03 NOTE — MAU Note (Signed)
Hansel Feinstein CNM in [place] Rm to discuss test results and d/c plan. Video interpreter used. PT then d/c home from [facility] RM

## 2022-02-04 LAB — GC/CHLAMYDIA PROBE AMP (~~LOC~~) NOT AT ARMC
Chlamydia: POSITIVE — AB
Comment: NEGATIVE
Comment: NORMAL
Neisseria Gonorrhea: NEGATIVE

## 2022-02-06 ENCOUNTER — Other Ambulatory Visit: Payer: Self-pay

## 2022-02-06 ENCOUNTER — Other Ambulatory Visit (HOSPITAL_COMMUNITY)
Admit: 2022-02-06 | Discharge: 2022-02-06 | Disposition: A | Discharge status: Home / Self Care | Payer: Self-pay | Attending: Obstetrics and Gynecology | Admitting: Obstetrics and Gynecology

## 2022-02-06 ENCOUNTER — Inpatient Hospital Stay (HOSPITAL_COMMUNITY)
Admission: AD | Admit: 2022-02-06 | Discharge: 2022-02-06 | Disposition: A | Discharge status: Home / Self Care | Payer: Self-pay | Attending: Obstetrics and Gynecology | Admitting: Obstetrics and Gynecology

## 2022-02-06 DIAGNOSIS — O039 Complete or unspecified spontaneous abortion without complication: Secondary | ICD-10-CM | POA: Insufficient documentation

## 2022-02-06 DIAGNOSIS — Z6790 Unspecified blood type, Rh positive: Secondary | ICD-10-CM | POA: Insufficient documentation

## 2022-02-06 LAB — CBC WITH DIFFERENTIAL/PLATELET
Abs Immature Granulocytes: 0.04 10*3/uL (ref 0.00–0.07)
Basophils Absolute: 0 10*3/uL (ref 0.0–0.1)
Basophils Relative: 0 %
Eosinophils Absolute: 0.1 10*3/uL (ref 0.0–0.5)
Eosinophils Relative: 1 %
HCT: 37.4 % (ref 36.0–46.0)
Hemoglobin: 12.6 g/dL (ref 12.0–15.0)
Immature Granulocytes: 0 %
Lymphocytes Relative: 18 %
Lymphs Abs: 1.6 10*3/uL (ref 0.7–4.0)
MCH: 29.3 pg (ref 26.0–34.0)
MCHC: 33.7 g/dL (ref 30.0–36.0)
MCV: 87 fL (ref 80.0–100.0)
Monocytes Absolute: 0.7 10*3/uL (ref 0.1–1.0)
Monocytes Relative: 7 %
Neutro Abs: 6.6 10*3/uL (ref 1.7–7.7)
Neutrophils Relative %: 74 %
Platelets: 345 10*3/uL (ref 150–400)
RBC: 4.3 MIL/uL (ref 3.87–5.11)
RDW: 13.3 % (ref 11.5–15.5)
WBC: 9.1 10*3/uL (ref 4.0–10.5)
nRBC: 0 % (ref 0.0–0.2)

## 2022-02-06 LAB — HCG, QUANTITATIVE, PREGNANCY: hCG, Beta Chain, Quant, S: 369 m[IU]/mL — ABNORMAL HIGH (ref <5)

## 2022-02-06 MED ORDER — ACETAMINOPHEN 500 MG PO TABS
1000.0000 mg | ORAL_TABLET | Freq: Once | ORAL | Status: AC
  Administered 2022-02-06: 1000 mg via ORAL
  Filled 2022-02-06: qty 2

## 2022-02-06 NOTE — Discharge Instructions (Signed)
AREA FAMILY PRACTICE PHYSICIANS  [place] ([address]) [facility] [address] [place], [place] [address] [phone] Mon-Fri 8:30-12:30, 1:30-5:00 Accepting Medicaid [facility] [address], [place], [place] [address] [phone] Mon-Fri 8:00-5:30 [facility] [address] [place], [place] [address] [phone] Mon, Tue, Thur, Fri 8:30-5:00, Wed 10:00-7:00 (closed 1-2pm) Accepting Medicaid [facility] [address], [place], [place]  [address] Phone - [phone]   Fax - [phone]  [place] ([address]) [facility] [address] [place], [place] [address] [phone] Mon-Fri 8:00-5:00 [facility] ([facility])  [address] [place], [place] [address] [phone] Mon-Fri 8:30-5:30, Sat (Oct.-Mar.) 9:00-1:00 Accepting Medicaid  [place] ([address]) [facility] [address], [place], [place] [address] [phone] Mon-Fri 8:00-5:00  [place] ([address]) [facility] [address], [place], [place] [address] [phone] Mon-Fri 8:00-5:00 [facility] [address], [place], [place] [address] [phone] Mon-Fri 8:00-5:00 [facility] [address] [place], [place] [address] [phone] Mon-Fri 8:00-5:00 [facility] [address] [place] [address] [phone] Mon-Fri 7:30-5:30  [place] ([address] & [address]) [facility] [address] [place], [place] [address] [phone] Mon-Thur 8:00-6:00 Accepting Medicaid [facility] [address] [place], [place] [address] [phone] Mon-Thur 7:30-7:30, Fri 7:30-4:30 Accepting  Medicaid [facility] [address], [place], [place]  [address] [phone]   Fax - [phone]  [place] ([address] & [address]) [facility] [address] , [place] [address] [phone] Mon-Fri 7:00-5:00 [facility] [address], [place], [place] [address] [phone] Mon-Fri 8:00-5:00 Accepting Medicaid [facility] [address], , [place] [address] [phone] Mon-Fri 8:00-5:00 Accepting Medicaid  [place] ([address]) [facility] [place] [address] [place], [place] [address] [phone] Mon-Fri 8:00-5:00 [facility] [address], [place], [place] [address] [phone] Mon-Fri 8:00-5:00 Accepting Medicaid [facility] ([facility]) [address], [place], [place] [address] [phone] Mon-Fri 8:00-5:30, Sat&Sun by appointment (phones open at 8:30) Accepting Medicaid  [place] ([address]) [facility] [address] [place], [place] [address] [phone] Mon-Thur 8:00-7:00, Fri 8:00-5:00, Sat 8:00-12:00, Sun 9:00-12:00 Accepting Medicaid [facility] [address], [place], [place] [address] [phone] Mon-Thur 8:00-5:00 Accepting Medicaid [facility] [address] [place], [place] [address] [phone] Mon-Fri 8:00-5:30, Sat (Oct.-Mar.) 9:00-1:00 Accepting Medicaid  [place] ([address]) [facility] [address], [place], [place] [address] [phone] Mon-Fri 8:00-5:00 Accepting Medicaid   [place] ([address]) [facility] [place] [address], [place], [place] [address] [phone] Mon-Fri 8:00-5:00 [facility] [place] [address], [place],  [address] [phone] Mon-Fri 8:00-5:00 [facility] [place] [address] [place] [address], [place],  [address] [phone] Mon-Fri 8:00-5:00 After hours clinic ([address] [place],  [address]) [phone] Mon-Fri 5:00-8:00, Sat 12:00-6:00, Sun 10:00-4:00 Accepting Medicaid [facility] [place]   [address], [place], [place]  [address] [phone]   Fax - [phone]  [place] ([address]) [facility] [place] [address], [place], [place] [address] [phone] Mon-Fri 8:00-5:00 [facility] [place] ([facility] [place]) [address], [place], [place] [address] [phone] Mon-Thur 8:00-7:00, Fri 8:00-5:00, Sat 8:00-12:00    

## 2022-02-06 NOTE — MAU Note (Signed)
Here for follow up blood work.  Still bleeding and passing clots.  Pain in lower abd and lower back.

## 2022-02-06 NOTE — MAU Provider Note (Signed)
Subjective:  Krystal Figueroa is a 36 y.o. G2P1001 at [redacted]w[redacted]d who presents today for FU BHCG. She was seen on 02/03/2022. Results from that day show no IUP on [place], and HCG 7,239. Patient was given diagnosis of PUL vs. SAB She reports vaginal bleeding today and states she is using approximately 4 pads daily. She reports abdominal or pelvic pain, but states she has not taken anything for the pain because she believed you could not take medication while pregnant. Patient reports the pain is moderate and states that she would like to take something for pain if this is OK. Patient reports she feels cramping in her low back and also her lower abdomen/pelvis.  Spanish interpreter used for entire visit.   Objective:  Physical Exam  Nursing note and vitals reviewed.  Patient Vitals for the past 24 hrs:  BP Temp Temp src Pulse Resp SpO2 Height Weight  02/06/22 1219 113/70 98.6 F (37 C) Oral 90 18 98 % 5' (1.524 m) 78.2 kg   Constitutional: She is oriented to person, place, and time. She appears well-developed and well-nourished. No distress.  HENT:  Head: Normocephalic.  Cardiovascular: Normal rate.  Respiratory: Effort normal.  GI: Soft. There is no tenderness.  Neurological: She is alert and oriented to person, place, and time. Skin: Skin is warm and dry.  Psychiatric: She has a normal mood and affect.   Results for orders placed or performed during the hospital encounter of 02/06/22 (from the past 24 hour(s))  hCG, quantitative, pregnancy     Status: Abnormal   Collection Time: 02/06/22 12:52 PM  Result Value Ref Range   hCG, Beta Chain, Quant, S 369 (H) <5 mIU/mL  CBC with Differential/Platelet     Status: None   Collection Time: 02/06/22 12:52 PM  Result Value Ref Range   WBC 9.1 4.0 - 10.5 K/uL   RBC 4.30 3.87 - 5.11 MIL/uL   Hemoglobin 12.6 12.0 - 15.0 g/dL   HCT 37.4 36.0 - 46.0 %   MCV 87.0 80.0 - 100.0 fL   MCH 29.3 26.0 - 34.0 pg   MCHC 33.7 30.0 - 36.0 g/dL   RDW 13.3  11.5 - 15.5 %   Platelets 345 150 - 400 K/uL   nRBC 0.0 0.0 - 0.2 %   Neutrophils Relative % 74 %   Neutro Abs 6.6 1.7 - 7.7 K/uL   Lymphocytes Relative 18 %   Lymphs Abs 1.6 0.7 - 4.0 K/uL   Monocytes Relative 7 %   Monocytes Absolute 0.7 0.1 - 1.0 K/uL   Eosinophils Relative 1 %   Eosinophils Absolute 0.1 0.0 - 0.5 K/uL   Basophils Relative 0 %   Basophils Absolute 0.0 0.0 - 0.1 K/uL   Immature Granulocytes 0 %   Abs Immature Granulocytes 0.04 0.00 - 0.07 K/uL   Assessment/Plan: Miscarriage RH Positive Tylenol 1000mg  given with complete resolution of pain HCG dropped appropriately for SAB Hgb unchanged FU in 2 weeks for: f/u SAB visit Return MAU precautions given Patient discharged to home in stable condition  Trennon Torbeck, Gerrie Nordmann, NP  2:54 PM 02/06/2022

## 2022-02-07 ENCOUNTER — Telehealth (HOSPITAL_COMMUNITY): Payer: Self-pay

## 2022-02-07 LAB — SURGICAL PATHOLOGY

## 2022-02-07 MED ORDER — AZITHROMYCIN 500 MG PO TABS: 1000.0000 mg | ORAL_TABLET | Freq: Once | ORAL | 0 refills | Status: AC

## 2022-02-08 ENCOUNTER — Telehealth: Payer: Self-pay | Admitting: *Deleted

## 2022-02-08 NOTE — Telephone Encounter (Signed)
I called Callia with [facility] and notified her of results and recommendations per Dr. Ilda Basset. I also reviewed her appointment date/ time and our address. She voices understanding. Esmerelda Finnigan,RN

## 2022-02-08 NOTE — Telephone Encounter (Signed)
-----   Message from Aletha Halim, MD sent at 02/08/2022  9:37 AM EST ----- Can you let her know that the tissue she passed and brought to the MAU showed a miscarriage and not an ectopic pregnancy; she still needs to keep her visit with dr. Rip Harbour but she doesn't need to follow hcgs to zero. Thanks!

## 2022-02-21 ENCOUNTER — Encounter: Payer: Self-pay | Admitting: Obstetrics and Gynecology

## 2022-02-21 ENCOUNTER — Ambulatory Visit (INDEPENDENT_AMBULATORY_CARE_PROVIDER_SITE_OTHER): Payer: Self-pay | Admitting: Obstetrics and Gynecology

## 2022-02-21 ENCOUNTER — Other Ambulatory Visit: Payer: Self-pay

## 2022-02-21 DIAGNOSIS — O039 Complete or unspecified spontaneous abortion without complication: Secondary | ICD-10-CM

## 2022-02-21 MED ORDER — DESOGESTREL-ETHINYL ESTRADIOL 0.15-30 MG-MCG PO TABS: 1.0000 | ORAL_TABLET | Freq: Every day | ORAL | 11 refills | Status: DC

## 2022-02-21 NOTE — Patient Instructions (Addendum)

## 2022-02-21 NOTE — Progress Notes (Signed)
Pt states no bleeding but pain in lower back.

## 2022-02-21 NOTE — Progress Notes (Signed)
Ms Krystal Figueroa presents for follow up of SAB.  See MAU notes. Pt reports bleeding has stopped. Denies bowel or bladder dysfunction. Has not taken treatment for Chlamydia nor has partner been treated.  PE AF VSS Lungs clear Heart RRR Abd soft BS  A/P SAB          Contraception        Chlamydia  Will check BHCG today. Advised to taken medication for STI and have partner treated ASAp. Advised to reframe form IC until TOC. OCP use reviewed with pt. Refer to [facility] for pap smear. F/U PRN or as per BHCG results Live interpreter used during today's visit.

## 2022-02-22 LAB — BETA HCG QUANT (REF LAB): hCG Quant: 2 m[IU]/mL

## 2022-02-23 ENCOUNTER — Telehealth: Payer: Self-pay | Admitting: *Deleted

## 2022-02-23 NOTE — Telephone Encounter (Signed)
-----   Message from Chancy Milroy, MD sent at 02/23/2022 10:37 AM EST ----- ?Please let pt know that her Sab is complete and no further lab work is necessary. ? ?Thanks ?Legrand Como  ?

## 2022-02-23 NOTE — Telephone Encounter (Signed)
I called patient with Krystal Figueroa, Interpreter and gave her results and recommendations. She voices understanding. ?Krystal Figueroa ?

## 2022-03-14 ENCOUNTER — Other Ambulatory Visit: Payer: Self-pay

## 2022-03-14 ENCOUNTER — Other Ambulatory Visit (HOSPITAL_COMMUNITY)
Admission: RE | Admit: 2022-03-14 | Discharge: 2022-03-14 | Disposition: A | Discharge status: Home / Self Care | Payer: Self-pay | Source: Ambulatory Visit | Attending: Family Medicine | Admitting: Family Medicine

## 2022-03-14 ENCOUNTER — Ambulatory Visit (INDEPENDENT_AMBULATORY_CARE_PROVIDER_SITE_OTHER): Payer: Self-pay | Admitting: General Practice

## 2022-03-14 VITALS — BP 116/71 | HR 86 | Ht 59.06 in | Wt 173.0 lb

## 2022-03-14 DIAGNOSIS — Z8619 Personal history of other infectious and parasitic diseases: Secondary | ICD-10-CM | POA: Insufficient documentation

## 2022-03-14 MED ORDER — DESOGESTREL-ETHINYL ESTRADIOL 0.15-30 MG-MCG PO TABS: 1.0000 | ORAL_TABLET | Freq: Every day | ORAL | 11 refills | Status: DC

## 2022-03-14 NOTE — Progress Notes (Signed)
Patient presents to office today for test of cure following recent + chlamydia on 2/9. Patient was instructed in self swab and specimen was collected. Advised we will contact her with any abnormal results. Patient asked about appt for pap smear. Discussed with patient it doesn't appear that has been set up but advised her I would reach out to the office- patient was supposed to be referred to [facility]. She also asked about OCP Rx. States she hasn't been able to go to the pharmacy due to limited transportation. Sent Rx to pharmacy closer to patient's house & informed her. Advised patient to call back if Rx was too expensive. Patient verbalized understanding and had no additional questions. ? ?Koren Bound RN BSN ?03/14/22 ? ?

## 2022-03-15 ENCOUNTER — Telehealth: Payer: Self-pay | Admitting: *Deleted

## 2022-03-15 ENCOUNTER — Encounter: Payer: Self-pay | Admitting: *Deleted

## 2022-03-15 DIAGNOSIS — A749 Chlamydial infection, unspecified: Secondary | ICD-10-CM

## 2022-03-15 LAB — GC/CHLAMYDIA PROBE AMP (~~LOC~~) NOT AT ARMC
Chlamydia: POSITIVE — AB
Comment: NEGATIVE
Comment: NORMAL
Neisseria Gonorrhea: NEGATIVE

## 2022-03-15 MED ORDER — DOXYCYCLINE HYCLATE 100 MG PO CAPS: 100.0000 mg | ORAL_CAPSULE | Freq: Two times a day (BID) | ORAL | 0 refills | Status: AC

## 2022-03-15 NOTE — Telephone Encounter (Signed)
-----   Message from Griffin Basil, MD sent at 03/15/2022  1:12 PM EDT ----- ?Chlamydia positive will offer treatment ?

## 2022-03-15 NOTE — Telephone Encounter (Signed)
Called patient with Interpreter Laural Golden and informed her TOC shows she is still positive for Chlamydia and needs retreatment. Now that she is GYN patient  Doxycycline sent in per protocol. Advised partner needs treatment and no sexual contact until both treated and wait 2 weeks. She states he said he was treated after first time she was +. Also advised needs TOC again in 3 months. Appointment made for 06/15/22.  ?Form for health department completed.  ?Staci Acosta ?

## 2022-05-17 ENCOUNTER — Encounter (INDEPENDENT_AMBULATORY_CARE_PROVIDER_SITE_OTHER): Payer: Self-pay

## 2022-05-17 ENCOUNTER — Other Ambulatory Visit: Payer: Self-pay | Admitting: *Deleted

## 2022-05-17 DIAGNOSIS — Z124 Encounter for screening for malignant neoplasm of cervix: Secondary | ICD-10-CM

## 2022-05-17 NOTE — Progress Notes (Signed)
Patient: Krystal Figueroa           Date of Birth: 04-Mar-1986           MRN: [id number] Visit Date: 05/17/2022 PCP: Jolaine Click, NP  Cervical Cancer Screening Do you smoke?: No Have you ever had or been told you have an allergy to latex products?: No Marital status: Single Date of last pap smear: 2-5 yrs ago (2021- Normal ([facility])) Date of last menstrual period: 05/07/22 Number of pregnancies: 2 Number of births: 1 Have you ever had any of the following? Hysterectomy: No Tubal ligation (tubes tied): No Abnormal bleeding: No Abnormal pap smear: No Venereal warts: No A sex partner with venereal warts: No A high risk* sex partner: No  Cervical Exam  Abnormal Observations: Normal Exam. Recommendations: Last Pap smear was in 2021 at the [facility] clinic and normal per patient. Per patient has no history of an abnormal Pap smear. No Pap smear results are available in Epic. Let patient know if today's Pap smear is normal and HPV negative that her next Pap smear is due in 5 years. Informed patient will follow up with her within the next couple of weeks with results of her Pap smear by phone. Patient verbalized understanding.    Spanish interpreter Rudene Anda from [facility] provided.  Patient's History Patient Active Problem List   Diagnosis Date Noted   SAB (spontaneous abortion) 02/21/2022   Past Medical History:  Diagnosis Date   Chlamydia infection affecting pregnancy    Medical history non-contributory    Supervision of high-risk pregnancy 11/28/2020   Vaginal delivery 11/29/2020    No family history on file.  Social History   Occupational History   Not on file  Tobacco Use   Smoking status: Never   Smokeless tobacco: Never  Vaping Use   Vaping Use: Never used  Substance and Sexual Activity   Alcohol use: Not Currently   Drug use: Never   Sexual activity: Not on file

## 2022-05-19 LAB — CYTOLOGY - PAP
Comment: NEGATIVE
Diagnosis: NEGATIVE
High risk HPV: NEGATIVE

## 2022-05-24 ENCOUNTER — Telehealth: Payer: Self-pay

## 2022-05-24 NOTE — Telephone Encounter (Signed)
Via Rudene Anda, [facility]) Patient informed negative Pap/HPV results, repeat pap in 1 year. Patient verbalized understanding.

## 2022-06-15 ENCOUNTER — Ambulatory Visit: Payer: Self-pay

## 2022-10-03 LAB — OB RESULTS CONSOLE HIV ANTIBODY (ROUTINE TESTING): HIV: NONREACTIVE

## 2022-10-03 LAB — HEPATITIS C ANTIBODY: HCV Ab: NEGATIVE

## 2022-10-03 LAB — OB RESULTS CONSOLE RUBELLA ANTIBODY, IGM: Rubella: IMMUNE

## 2022-10-03 LAB — OB RESULTS CONSOLE HEPATITIS B SURFACE ANTIGEN: Hepatitis B Surface Ag: NEGATIVE

## 2022-12-22 LAB — OB RESULTS CONSOLE HIV ANTIBODY (ROUTINE TESTING): HIV: NONREACTIVE

## 2022-12-26 NOTE — L&D Delivery Note (Addendum)
Delivery Note 37 y.o. G3P1001 at 4w1dadmitted with spontaneous onset of labor and noted have SROM shortly after arrival.   At 7:53 AM a viable female was delivered via Vaginal, Spontaneous (Presentation: Left Occiput Anterior).  APGAR: 9, 9; weight  TBD.   Placenta status: Spontaneous, Intact.  Cord: 3 vessels with the following complications: None.  Cord pH: not collected.  Anesthesia: None Episiotomy: None Lacerations: None Suture Repair:  none Est. Blood Loss (mL): 273  Mom to postpartum.  Baby to Couplet care / Skin to Skin.  CLiliane ChannelMD MPH OB Fellow, FNorthwest Harborfor WEl Quiote3/13/2024

## 2023-03-08 ENCOUNTER — Encounter (HOSPITAL_COMMUNITY): Payer: Self-pay

## 2023-03-08 ENCOUNTER — Other Ambulatory Visit: Payer: Self-pay

## 2023-03-08 ENCOUNTER — Inpatient Hospital Stay (HOSPITAL_COMMUNITY)
Admission: AD | Admit: 2023-03-08 | Discharge: 2023-03-09 | DRG: 807 | Disposition: A | Discharge status: Home / Self Care | Payer: Medicaid Other | Attending: Obstetrics and Gynecology | Admitting: Obstetrics and Gynecology

## 2023-03-08 DIAGNOSIS — Z3A39 39 weeks gestation of pregnancy: Secondary | ICD-10-CM

## 2023-03-08 DIAGNOSIS — O134 Gestational [pregnancy-induced] hypertension without significant proteinuria, complicating childbirth: Secondary | ICD-10-CM | POA: Diagnosis present

## 2023-03-08 DIAGNOSIS — O26893 Other specified pregnancy related conditions, third trimester: Secondary | ICD-10-CM | POA: Diagnosis present

## 2023-03-08 DIAGNOSIS — O4202 Full-term premature rupture of membranes, onset of labor within 24 hours of rupture: Secondary | ICD-10-CM

## 2023-03-08 DIAGNOSIS — O479 False labor, unspecified: Secondary | ICD-10-CM | POA: Diagnosis present

## 2023-03-08 LAB — CBC
HCT: 37.5 % (ref 36.0–46.0)
Hemoglobin: 12.9 g/dL (ref 12.0–15.0)
MCH: 29.5 pg (ref 26.0–34.0)
MCHC: 34.4 g/dL (ref 30.0–36.0)
MCV: 85.8 fL (ref 80.0–100.0)
Platelets: 291 10*3/uL (ref 150–400)
RBC: 4.37 MIL/uL (ref 3.87–5.11)
RDW: 14.3 % (ref 11.5–15.5)
WBC: 9.2 10*3/uL (ref 4.0–10.5)
nRBC: 0 % (ref 0.0–0.2)

## 2023-03-08 LAB — RPR: RPR Ser Ql: NONREACTIVE

## 2023-03-08 LAB — TYPE AND SCREEN
ABO/RH(D): O POS
Antibody Screen: NEGATIVE

## 2023-03-08 LAB — POCT FERN TEST: POCT Fern Test: POSITIVE

## 2023-03-08 MED ORDER — IBUPROFEN 600 MG PO TABS
600.0000 mg | ORAL_TABLET | Freq: Four times a day (QID) | ORAL | Status: DC
  Administered 2023-03-08 – 2023-03-09 (×5): 600 mg via ORAL
  Filled 2023-03-08 (×5): qty 1

## 2023-03-08 MED ORDER — ZOLPIDEM TARTRATE 5 MG PO TABS: 5.0000 mg | ORAL_TABLET | Freq: Every evening | ORAL | Status: DC | PRN

## 2023-03-08 MED ORDER — ACETAMINOPHEN 325 MG PO TABS
650.0000 mg | ORAL_TABLET | ORAL | Status: DC | PRN
  Administered 2023-03-08: 650 mg via ORAL
  Filled 2023-03-08: qty 2

## 2023-03-08 MED ORDER — ONDANSETRON HCL 4 MG/2ML IJ SOLN: 4.0000 mg | INTRAMUSCULAR | Status: DC | PRN

## 2023-03-08 MED ORDER — PHENYLEPHRINE 80 MCG/ML (10ML) SYRINGE FOR IV PUSH (FOR BLOOD PRESSURE SUPPORT): 80.0000 ug | PREFILLED_SYRINGE | INTRAVENOUS | Status: DC | PRN

## 2023-03-08 MED ORDER — SODIUM CHLORIDE 0.9 % IV SOLN: INTRAVENOUS | Status: DC | PRN

## 2023-03-08 MED ORDER — COCONUT OIL OIL: 1.0000 | TOPICAL_OIL | Status: DC | PRN

## 2023-03-08 MED ORDER — ONDANSETRON HCL 4 MG PO TABS: 4.0000 mg | ORAL_TABLET | ORAL | Status: DC | PRN

## 2023-03-08 MED ORDER — FENTANYL CITRATE (PF) 100 MCG/2ML IJ SOLN: 100.0000 ug | INTRAMUSCULAR | Status: DC | PRN

## 2023-03-08 MED ORDER — OXYCODONE HCL 5 MG PO TABS: 5.0000 mg | ORAL_TABLET | ORAL | Status: DC | PRN

## 2023-03-08 MED ORDER — DIBUCAINE (PERIANAL) 1 % EX OINT: 1.0000 | TOPICAL_OINTMENT | CUTANEOUS | Status: DC | PRN

## 2023-03-08 MED ORDER — SIMETHICONE 80 MG PO CHEW: 80.0000 mg | CHEWABLE_TABLET | ORAL | Status: DC | PRN

## 2023-03-08 MED ORDER — LACTATED RINGERS IV SOLN: 500.0000 mL | INTRAVENOUS | Status: DC | PRN

## 2023-03-08 MED ORDER — SODIUM CHLORIDE 0.9% FLUSH: 3.0000 mL | INTRAVENOUS | Status: DC | PRN

## 2023-03-08 MED ORDER — DIPHENHYDRAMINE HCL 25 MG PO CAPS: 25.0000 mg | ORAL_CAPSULE | Freq: Four times a day (QID) | ORAL | Status: DC | PRN

## 2023-03-08 MED ORDER — HYDROXYZINE HCL 50 MG PO TABS: 50.0000 mg | ORAL_TABLET | Freq: Four times a day (QID) | ORAL | Status: DC | PRN

## 2023-03-08 MED ORDER — BENZOCAINE-MENTHOL 20-0.5 % EX AERO: 1.0000 | INHALATION_SPRAY | CUTANEOUS | Status: DC | PRN

## 2023-03-08 MED ORDER — EPHEDRINE 5 MG/ML INJ: 10.0000 mg | INTRAVENOUS | Status: DC | PRN

## 2023-03-08 MED ORDER — SOD CITRATE-CITRIC ACID 500-334 MG/5ML PO SOLN: 30.0000 mL | ORAL | Status: DC | PRN

## 2023-03-08 MED ORDER — OXYCODONE-ACETAMINOPHEN 5-325 MG PO TABS: 2.0000 | ORAL_TABLET | ORAL | Status: DC | PRN

## 2023-03-08 MED ORDER — OXYTOCIN-SODIUM CHLORIDE 30-0.9 UT/500ML-% IV SOLN
2.5000 [IU]/h | INTRAVENOUS | Status: DC
  Administered 2023-03-08: 2.5 [IU]/h via INTRAVENOUS
  Filled 2023-03-08: qty 500

## 2023-03-08 MED ORDER — DIPHENHYDRAMINE HCL 50 MG/ML IJ SOLN: 12.5000 mg | INTRAMUSCULAR | Status: DC | PRN

## 2023-03-08 MED ORDER — LIDOCAINE HCL (PF) 1 % IJ SOLN: 30.0000 mL | INTRAMUSCULAR | Status: DC | PRN

## 2023-03-08 MED ORDER — LACTATED RINGERS IV SOLN: 500.0000 mL | Freq: Once | INTRAVENOUS | Status: DC

## 2023-03-08 MED ORDER — SENNOSIDES-DOCUSATE SODIUM 8.6-50 MG PO TABS
2.0000 | ORAL_TABLET | ORAL | Status: DC
  Administered 2023-03-08 – 2023-03-09 (×2): 2 via ORAL
  Filled 2023-03-08 (×2): qty 2

## 2023-03-08 MED ORDER — SODIUM CHLORIDE 0.9% FLUSH: 3.0000 mL | Freq: Two times a day (BID) | INTRAVENOUS | Status: DC

## 2023-03-08 MED ORDER — OXYTOCIN BOLUS FROM INFUSION
333.0000 mL | Freq: Once | INTRAVENOUS | Status: AC
  Administered 2023-03-08: 333 mL via INTRAVENOUS

## 2023-03-08 MED ORDER — OXYCODONE-ACETAMINOPHEN 5-325 MG PO TABS: 1.0000 | ORAL_TABLET | ORAL | Status: DC | PRN

## 2023-03-08 MED ORDER — FENTANYL-BUPIVACAINE-NACL 0.5-0.125-0.9 MG/250ML-% EP SOLN: 12.0000 mL/h | EPIDURAL | Status: DC | PRN

## 2023-03-08 MED ORDER — ACETAMINOPHEN 325 MG PO TABS: 650.0000 mg | ORAL_TABLET | ORAL | Status: DC | PRN

## 2023-03-08 MED ORDER — ONDANSETRON HCL 4 MG/2ML IJ SOLN: 4.0000 mg | Freq: Four times a day (QID) | INTRAMUSCULAR | Status: DC | PRN

## 2023-03-08 MED ORDER — FLEET ENEMA 7-19 GM/118ML RE ENEM: 1.0000 | ENEMA | RECTAL | Status: DC | PRN

## 2023-03-08 MED ORDER — LACTATED RINGERS IV SOLN: INTRAVENOUS | Status: DC

## 2023-03-08 MED ORDER — PRENATAL MULTIVITAMIN CH
1.0000 | ORAL_TABLET | Freq: Every day | ORAL | Status: DC
  Administered 2023-03-08 – 2023-03-09 (×2): 1 via ORAL
  Filled 2023-03-08 (×2): qty 1

## 2023-03-08 MED ORDER — WITCH HAZEL-GLYCERIN EX PADS: 1.0000 | MEDICATED_PAD | CUTANEOUS | Status: DC | PRN

## 2023-03-08 NOTE — H&P (Signed)
Krystal Figueroa is a 37 y.o. G54P1001 female at 27w1dby LMP c/w early u/s presenting w/ SOL and SROM 0650.   Reports active fetal movement, contractions: regular, every 3-5 minutes, vaginal bleeding: scant staining, membranes: ruptured, clear fluid.  [facility]    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  Past Medical History: Past Medical History:  Diagnosis Date   Chlamydia infection affecting pregnancy    Medical history non-contributory    Supervision of high-risk pregnancy 11/28/2020   Vaginal delivery 11/29/2020    Past Surgical History: Past Surgical History:  Procedure Laterality Date   HERNIA REPAIR      Obstetrical History: OB History     Gravida  3   Para  1   Term  1   Preterm      AB      Living  1      SAB      IAB      Ectopic      Multiple      Live Births  1           Social History: Social History   Socioeconomic History   Marital status: Single    Spouse name: Not on file   Number of children: Not on file   Years of education: Not on file   Highest education level: Not on file  Occupational History   Not on file  Tobacco Use   Smoking status: Never   Smokeless tobacco: Never  Vaping Use   Vaping Use: Never used  Substance and Sexual Activity   Alcohol use: Not Currently   Drug use: Never   Sexual activity: Yes  Other Topics Concern   Not on file  Social History Narrative   Not on file   Social Determinants of Health   Financial Resource Strain: Not on file  Food Insecurity: Not on file  Transportation Needs: Not on file  Physical Activity: Not on file  Stress: Not on file  Social Connections: Not on file    Family History: History reviewed. No pertinent family history.  Allergies: No Known Allergies  Medications Prior to Admission  Medication Sig Dispense Refill Last Dose   Prenatal  Vit-Fe Fumarate-FA (PRENATAL MULTIVITAMIN) TABS tablet Take 1 tablet by mouth daily at 12 noon.   03/07/2023   acetaminophen (TYLENOL) 325 MG tablet Take 2 tablets (650 mg total) by mouth every 6 (six) hours as needed for mild pain, moderate pain, fever or headache (for pain scale < 4).      amLODipine (NORVASC) 5 MG tablet TAKE 1 TABLET (5 MG TOTAL) BY MOUTH DAILY. 45 tablet 1    desogestrel-ethinyl estradiol (APRI) 0.15-30 MG-MCG tablet Take 1 tablet by mouth daily. 28 tablet 11     Review of Systems  Pertinent pos/neg as indicated in HPI  Blood pressure (!) 155/92, pulse 81, temperature (!) 97.5 F (36.4 C), temperature source Oral, resp. rate 20, last menstrual period 05/07/2022, unknown if currently breastfeeding. General appearance: alert and cooperative Lungs: clear to auscultation bilaterally Heart: regular rate and rhythm Abdomen: gravid, soft, non-tender Extremities: no edema  Fetal monitoring: FHR: 135 bpm, variability: moderate,  Accelerations: Present,  decelerations:  possible variables w/ uc's- not tracing well, RN to readjust when comes up to L&D Uterine activity: q 2-3 mins Dilation: 8 Effacement (%): 90 Station: -1 Exam by:: EDarliss RidgelRN Presentation: cephalic  Prenatal labs: ABO, Rh: --/--/PENDING (03/13 0703)O+ Antibody: PENDING (03/13 0703)neg Rubella:  imm RPR:   NR HBsAg:   neg HIV:   NR GBS:   neg 1hr glucola: 112  Results for orders placed or performed during the hospital encounter of 03/08/23 (from the past 24 hour(s))  [facility] Time: 03/08/23  7:00 AM  Result Value Ref Range   POCT Fern Test Positive = ruptured amniotic membanes   Type and screen Onycha   Collection Time: 03/08/23  7:03 AM  Result Value Ref Range   ABO/RH(D) PENDING    Antibody Screen PENDING    Sample Expiration      03/11/2023,2359 Performed at [facility], [place] [address] [place], [place] [address]   CBC   Collection  Time: 03/08/23  7:08 AM  Result Value Ref Range   WBC 9.2 4.0 - 10.5 K/uL   RBC 4.37 3.87 - 5.11 MIL/uL   Hemoglobin 12.9 12.0 - 15.0 g/dL   HCT 37.5 36.0 - 46.0 %   MCV 85.8 80.0 - 100.0 fL   MCH 29.5 26.0 - 34.0 pg   MCHC 34.4 30.0 - 36.0 g/dL   RDW 14.3 11.5 - 15.5 %   Platelets 291 150 - 400 K/uL   nRBC 0.0 0.0 - 0.2 %     Assessment:  72w1dSIUP  G3P1001  SOL/SROM  Cat 2 FHR  GBS  neg  Elevated bp x 1 w/ h/o GHTN/PPHTN  Plan:  Admit to L&D  IV pain meds/epidural prn active labor  Expectant management  Monitor bp's, likely related to pain from contractions. need for pre-e labs  Anticipate NSVB   Plans to breast and bottle feed  Contraception: POPs  Circumcision: n/a  CLiliane ChannelMD MPH OB Fellow, FShiremanstownfor WTroy3/13/2024

## 2023-03-08 NOTE — Discharge Summary (Shared)
Postpartum Discharge Summary  Date of Service updated***     Patient Name: Krystal Figueroa DOB: 1986-05-27 MRN: [id number]  Date of admission: 03/08/2023 Delivery date:03/08/2023  Delivering provider: Stormy Card  Date of discharge: 03/08/2023  Admitting diagnosis: Normal labor [O80, Z37.9] Intrauterine pregnancy: 106w1d    Secondary diagnosis:  Principal Problem:   Uterine contractions Active Problems:   Normal labor  Additional problems: none    Discharge diagnosis: Term Pregnancy Delivered                                              Post partum procedures: none Augmentation:  none Complications: None  Hospital course: Onset of Labor With Vaginal Delivery      37y.o. yo G3P1001 at 35w1das admitted in Active Labor on 03/08/2023. Labor course was complicated by: none  Membrane Rupture Time/Date: 6:50 AM ,03/08/2023   Delivery Method:Vaginal, Spontaneous  Episiotomy: None  Lacerations:  None  Patient had an uncomplicated postpartum course, remaining normotensive on the Mother/Baby Unit. She is ambulating, tolerating a regular diet, passing flatus, and urinating well. Patient is discharged home in stable condition on 03/08/23 per her request for early d/c as long as the baby can go.  Newborn Data: Birth date:03/08/2023  Birth time:7:53 AM  Gender:Female  Living status:Living  Apgars:9 ,9  Weight:3610 g (7lb 15.3oz)  Magnesium Sulfate received: No BMZ received: No Rhophylac:N/A MMR:N/A, Rubella Immune T-DaP:Given prenatally Flu: No Transfusion:No  Physical exam  Vitals:   03/08/23 1030 03/08/23 1217 03/08/23 1430 03/08/23 2200  BP: 122/83  132/82 121/78  Pulse: 96  91 94  Resp: '18  18 18  '$ Temp: 98.4 F (36.9 C)  98.8 F (37.1 C) 98 F (36.7 C)  TempSrc:   Oral Oral  SpO2: 100%  100% 99%  Weight:  90.7 kg    Height:  '5\' 2"'$  (1.575 m)     General: alert and cooperative Lochia: appropriate Uterine Fundus: firm Incision: N/A DVT  Evaluation: No evidence of DVT seen on physical exam. Labs: Lab Results  Component Value Date   WBC 9.2 03/08/2023   HGB 12.9 03/08/2023   HCT 37.5 03/08/2023   MCV 85.8 03/08/2023   PLT 291 03/08/2023      Latest Ref Rng & Units 11/28/2020   12:26 AM  CMP  Glucose 70 - 99 mg/dL 99   BUN 6 - 20 mg/dL 5   Creatinine 0.44 - 1.00 mg/dL 0.50   Sodium 135 - 145 mmol/L 139   Potassium 3.5 - 5.1 mmol/L 3.8   Chloride 98 - 111 mmol/L 110   CO2 22 - 32 mmol/L 19   Calcium 8.9 - 10.3 mg/dL 9.0   Total Protein 6.5 - 8.1 g/dL 6.1   Total Bilirubin 0.3 - 1.2 mg/dL 0.4   Alkaline Phos 38 - 126 U/L 170   AST 15 - 41 U/L 32   ALT 0 - 44 U/L 21    Edinburgh Score:    03/08/2023    2:30 PM  Edinburgh Postnatal Depression Scale Screening Tool  I have been able to laugh and see the funny side of things. 0  I have looked forward with enjoyment to things. 0  I have blamed myself unnecessarily when things went wrong. 0  I have been anxious or worried for no good reason. 0  I have felt scared or panicky for no good reason. 0  Things have been getting on top of me. 0  I have been so unhappy that I have had difficulty sleeping. 1  I have felt sad or miserable. 0  I have been so unhappy that I have been crying. 0  The thought of harming myself has occurred to me. 0  Edinburgh Postnatal Depression Scale Total 1     After visit meds:  Allergies as of 03/08/2023   No Known Allergies   Med Rec must be completed prior to using this [facility]***        Discharge home in stable condition Infant Feeding: Bottle and Breast Infant Disposition:home with mother Discharge instruction: per After Visit Summary and Postpartum booklet. Activity: Advance as tolerated. Pelvic rest for 6 weeks.  Diet: routine diet Future Appointments:No future appointments. Follow up Visit:  Follow-up Information     Department, [facility]. Schedule an appointment as soon as possible for a visit in 4  week(s).   Why: For your postpartum appointment Contact information: [place] [address] [phone]                  03/08/2023 Myrtis Ser, CNM

## 2023-03-08 NOTE — MAU Note (Signed)
Krystal Figueroa is a 37 y.o. at 65w1dhere in MAU reporting: ctx every 5 minutes. Pt states her water broke out in the lobby when she got here. +FM. Pt denies VB. The water on the pad looks like clear fluid.   Onset of complaint: 03/08/2023 Pain score: 8/10 Vitals:   03/08/23 0700  BP: (!) 155/92  Pulse: 81  Resp: 20  Temp: (!) 97.5 F (36.4 C)     FHT: 145 Lab orders placed from triage:

## 2023-03-08 NOTE — Lactation Note (Signed)
This note was copied from a baby's chart. Lactation Consultation Note  Patient Name: Krystal Figueroa [id number] Date: 03/08/2023 Age:35 hours Reason for consult: Initial assessment;Term  Consult was done in Spanish: Initial visit to 7 hours old infant. Demonstrated hand expression, unable to see colostrum. Provided hand pump for colostrum expression, unable to see colostrum. Infant started cueing, LC assisted with latch after. Infant latches successfully with ease.   Discussed normal newborn behavior and patterns, signs of good milk transfer, hunger cues, tummy size and benefits of skin to skin.   Plan: 1-Breastfeeding on demand or 8-12 times in 24h period. 2-Use manual pump as needed 3-Encouraged birthing parent rest, hydration and food intake.   Contact LC as needed for feeds/support/concerns/questions. All questions answered at this time. Provided Lactation services brochure.  Maternal Data Has patient been taught Hand Expression?: Yes Does the patient have breastfeeding experience prior to this delivery?: Yes How long did the patient breastfeed?: 18 months  Feeding Mother's Current Feeding Choice: Breast Milk and Formula  LATCH Score Latch: Grasps breast easily, tongue down, lips flanged, rhythmical sucking.  Audible Swallowing: Spontaneous and intermittent  Type of Nipple: Everted at rest and after stimulation  Comfort (Breast/Nipple): Soft / non-tender  Hold (Positioning): Assistance needed to correctly position infant at breast and maintain latch.  LATCH Score: 9   Lactation Tools Discussed/Used Tools: Pump;Flanges Flange Size: 21 Breast pump type: Manual Pump Education: Milk Storage;Setup, frequency, and cleaning Reason for Pumping: stimulation and supplementation Pumping frequency: as needed  Interventions Interventions: Breast feeding basics reviewed;Assisted with latch;Skin to skin;Breast massage;Hand express;Breast compression;Hand pump;Expressed  milk;Adjust position;Education;LC Services brochure;Pace feeding  Discharge Pump: Manual WIC Program: Yes  Consult Status Consult Status: Follow-up Date: 03/09/23 Follow-up type: In-patient    Krystal Figueroa 03/08/2023, 2:06 PM

## 2023-03-09 LAB — BIRTH TISSUE RECOVERY COLLECTION (PLACENTA DONATION)

## 2023-03-09 MED ORDER — IBUPROFEN 600 MG PO TABS: 600.0000 mg | ORAL_TABLET | Freq: Four times a day (QID) | ORAL | 0 refills | Status: AC | PRN

## 2023-03-09 MED ORDER — NORETHINDRONE 0.35 MG PO TABS: 1.0000 | ORAL_TABLET | Freq: Every day | ORAL | 11 refills | Status: AC

## 2023-03-09 NOTE — Plan of Care (Signed)

## 2023-03-09 NOTE — Progress Notes (Addendum)
Patient ID: Krystal Figueroa, female   DOB: 1986-10-29, 37 y.o.   MRN: [id number]  POSTPARTUM PROGRESS NOTE  Post Partum Day 1  Of note, pt speaks primarily Spanish and a video interpreter was used for this visit.  Subjective:  Krystal Figueroa is a 37 y.o. [id number] s/p SVD at [redacted]w[redacted]d  She reports she is doing well. No acute events overnight. She denies any problems with ambulating, voiding or po intake. Denies nausea or vomiting. No headache, lightheadedness, changes in vision, CP, SOB. Pain is well controlled.  Lochia is none to minimal.  Objective: Blood pressure 124/80, pulse 96, temperature 98.2 F (36.8 C), temperature source Oral, resp. rate 18, height '5\' 2"'$  (1.575 m), weight 90.7 kg, last menstrual period 05/07/2022, SpO2 100 %, unknown if currently breastfeeding.  Physical Exam:  General: alert, cooperative and no distress Chest: no respiratory distress Abdomen: soft, nontender,  Uterine Fundus: firm, appropriately tender DVT Evaluation: No calf swelling or tenderness Extremities: Minimal edema Skin: warm, dry  Recent Labs    03/08/23 0708  HGB 12.9  HCT 37.5    Assessment/Plan: Krystal Figueroa a 36y.o. [id number]/p SVD at 321w1d PPD#1  - Doing well. No concerns   Routine postpartum care - Contraception: Pills - Feeding: Breast and bottle  Dispo - Plan for discharge today.   LOS: 1 day   AmCarmelina DaneMedical Student 03/09/2023, 6:35 AM

## 2023-03-09 NOTE — Lactation Note (Signed)
This note was copied from a baby's chart. Lactation Consultation Note  Patient Name: Krystal Figueroa [id number] Date: 03/09/2023 Age:35 hours  Reason for consult: Follow-up assessment;Term  P2, 39.1 GA, 3% weight loss  Gwynn Spanish Interpreter, Lily, present for interpretation during [place] visit. Upon entry, mother was breastfeeding "[name]" and infant was latched and feeding well. Mother has concerns that she does not have milk available for baby. Mother was able to hand express and see colostrum. She reports she will formula feed until her her full milk volume comes in. She denies any questions or concerns. Mother is supplementing by choice with formula.  Mom made aware of O/P services, breastfeeding support groups, community resources, and our phone # for post-discharge questions.     Maternal Data Has patient been taught Hand Expression?: Yes Does the patient have breastfeeding experience prior to this delivery?: Yes  Feeding Mother's Current Feeding Choice: Breast Milk and Formula  LATCH Score Latch: Grasps breast easily, tongue down, lips flanged, rhythmical sucking.  Audible Swallowing: A few with stimulation  Type of Nipple: Everted at rest and after stimulation  Comfort (Breast/Nipple): Soft / non-tender  Hold (Positioning): No assistance needed to correctly position infant at breast.  LATCH Score: 9   Lactation Tools Discussed/Used Breast pump type: Manual Pumping frequency: as needed  Interventions Interventions: Education  Discharge Pump: Manual  Consult Status Consult Status: Follow-up Date: 03/10/23 Follow-up type: In-patient    Stana Bunting M 03/09/2023, 3:41 PM

## 2023-03-20 ENCOUNTER — Telehealth (HOSPITAL_COMMUNITY): Payer: Self-pay

## 2023-03-20 NOTE — Telephone Encounter (Signed)
Patient did not answer phone call. Voicemail left for patient.   [facility]  03/20/23,1018

## 2023-07-31 IMAGING — US US OB < 14 WEEKS - US OB TV
1 series · 15 of 28 positions shown · non-contrast
Comparison: None.

CLINICAL DATA: Pregnant, heavy vaginal bleeding.  LMP 11/29/2021

EXAM:
OBSTETRIC <14 WK US AND TRANSVAGINAL OB US
TECHNIQUE: Both transabdominal and transvaginal ultrasound examinations were
performed for complete evaluation of the gestation as well as the
maternal uterus, adnexal regions, and pelvic cul-de-sac.
Transvaginal technique was performed to assess early pregnancy.

[Series 1: us ob < 14 weeks - us ob tv · 15 of 66 slices shown]
[im 1/66]
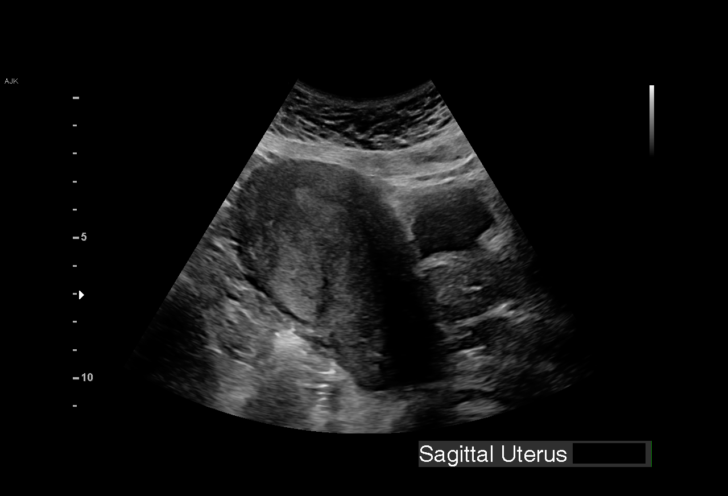
[im 5/66]
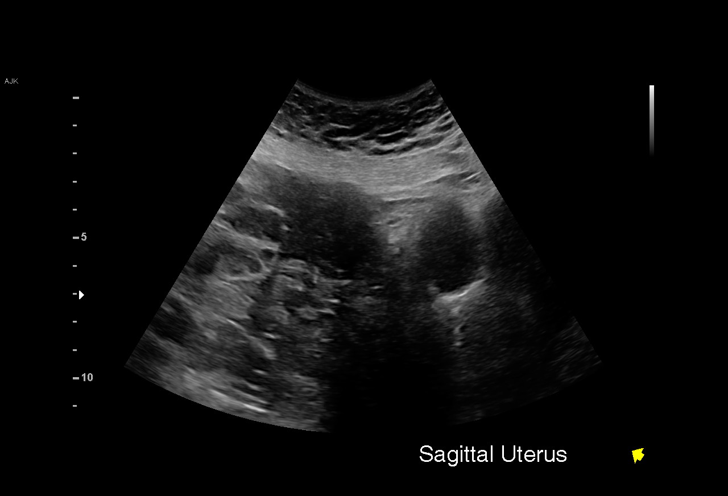
[im 10/66]
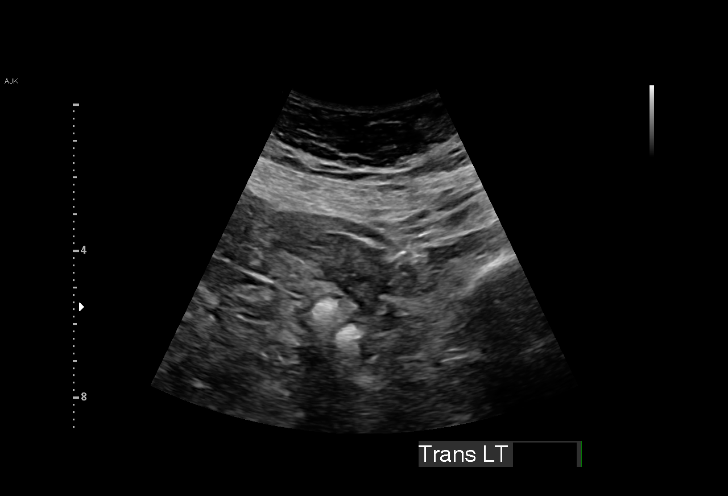
[im 15/66]
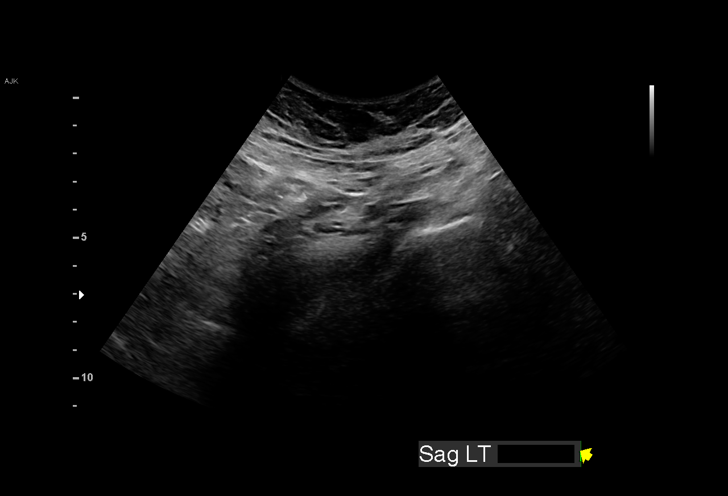
[im 20/66]
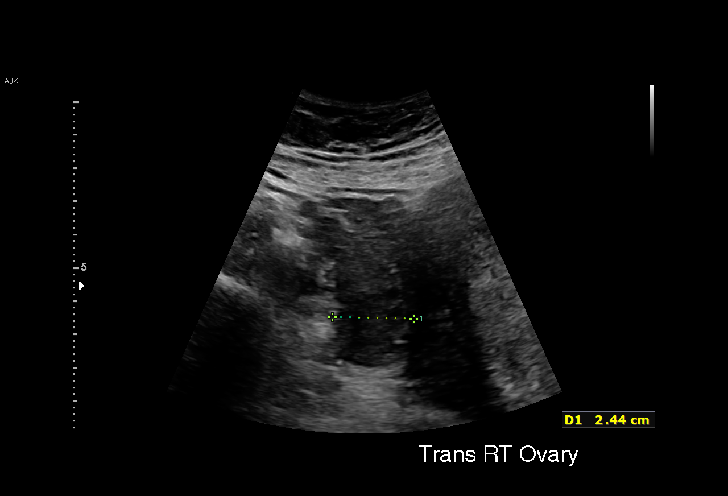
[im 25/66]
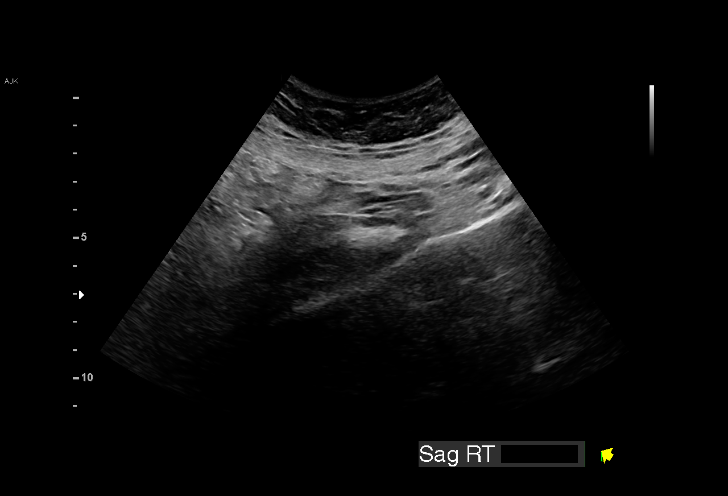
[im 29/66]
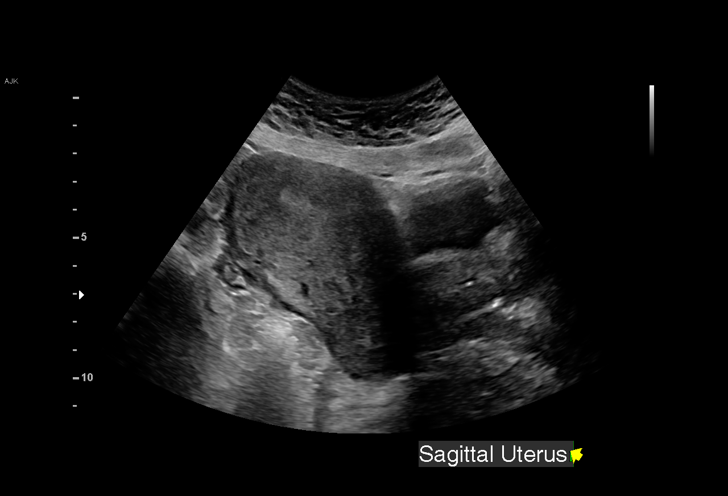
[im 34/66]
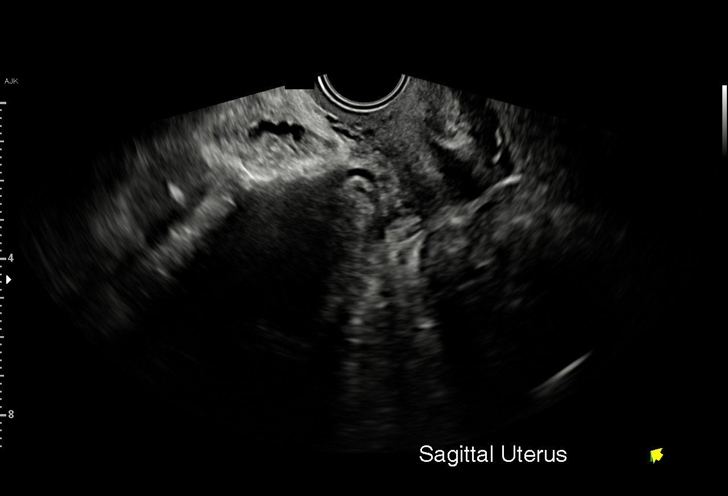
[im 37/66]
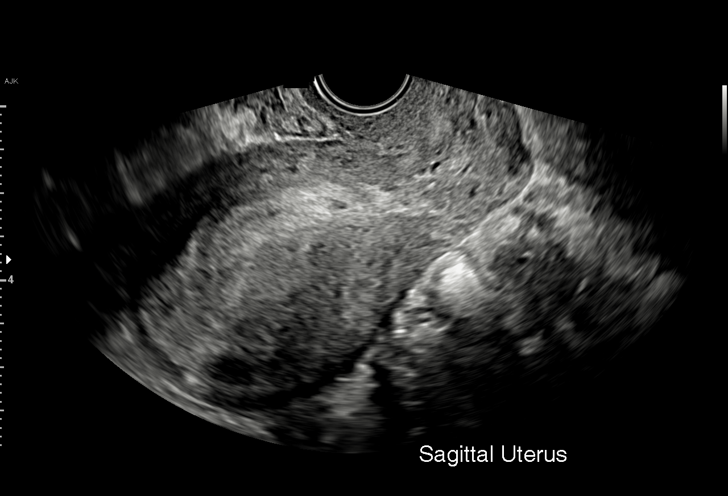
[im 41/66]
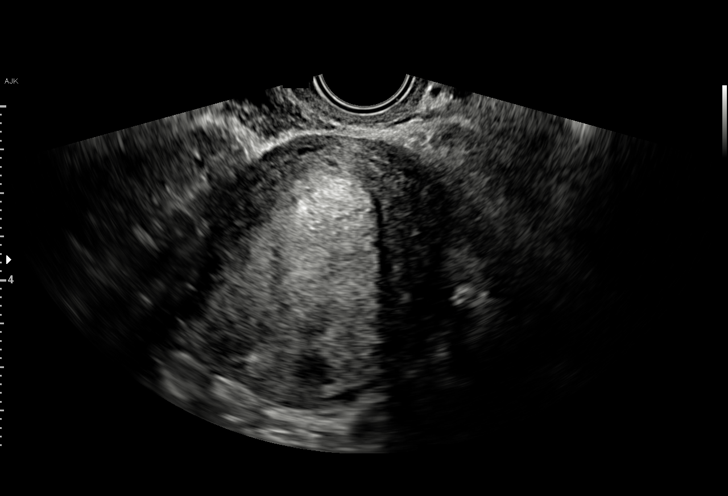
[im 46/66]
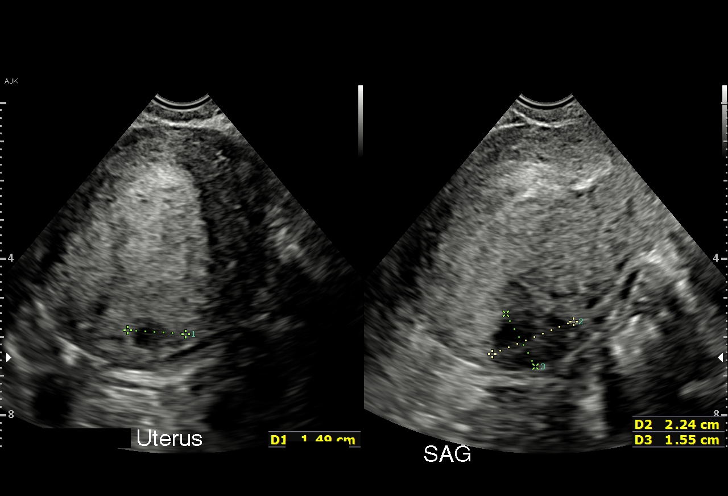
[im 51/66]
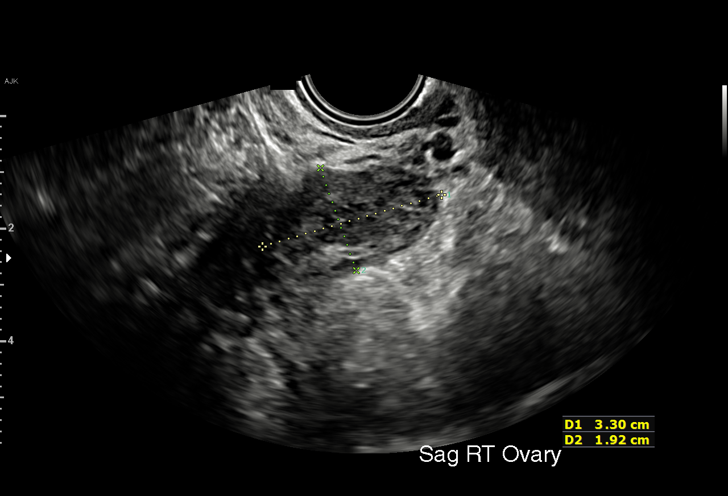
[im 56/66]
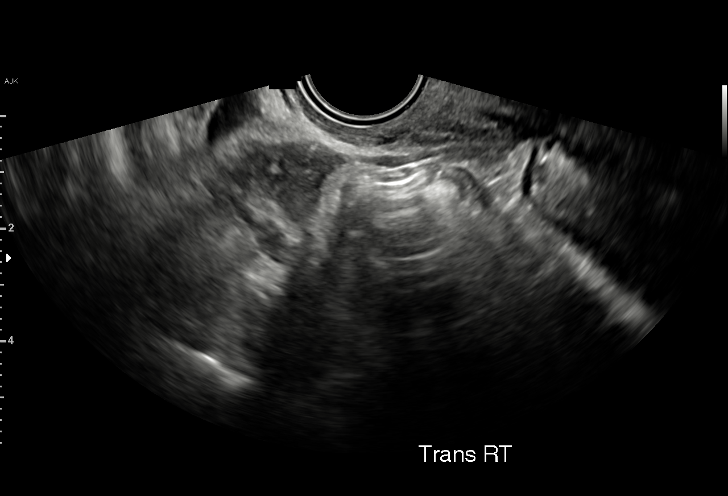
[im 61/66]
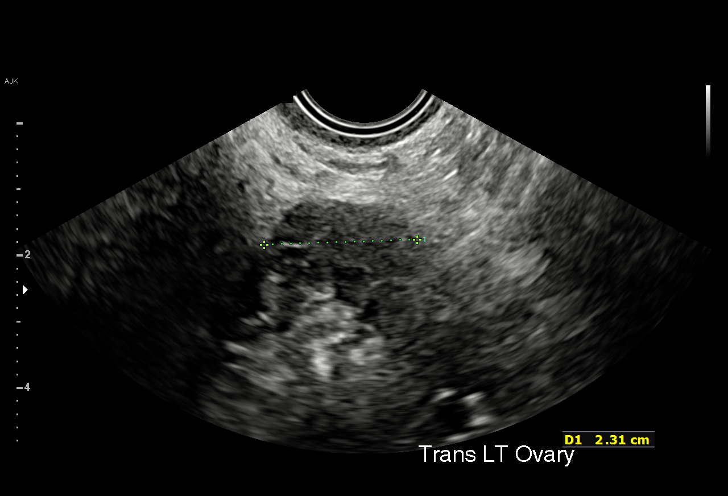
[im 66/66]
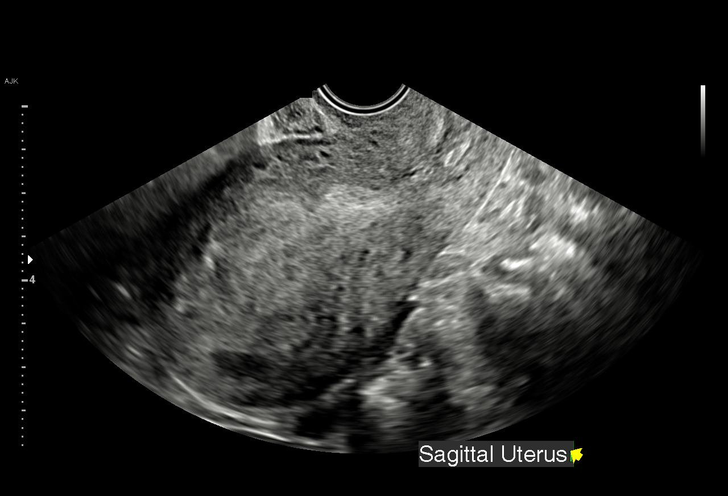

[15 of 28 positions shown; findings below may reference images not displayed]

FINDINGS: Intrauterine gestational sac: None identified

Maternal uterus/adnexae: The uterus is anteverted. Cervix
unremarkable. A heterogeneously hypoechoic 2.2 cm mass is seen
within the posterior fundus most in keeping with an intramural
fibroid. The endometriuml is thickened measuring up to 23 mm in
diameter, but as noted above, no intrauterine gestational sac is
identified. There is no free fluid seen within the cul-de-sac. The
maternal ovaries are unremarkable.
IMPRESSION: No intrauterine gestational sac identified. Endometrial thickening
may be the residua of failed pregnancy.

## 2023-08-31 DIAGNOSIS — Z833 Family history of diabetes mellitus: Secondary | ICD-10-CM | POA: Diagnosis not present

## 2023-08-31 DIAGNOSIS — E669 Obesity, unspecified: Secondary | ICD-10-CM | POA: Diagnosis not present

## 2023-08-31 DIAGNOSIS — Z6834 Body mass index (BMI) 34.0-34.9, adult: Secondary | ICD-10-CM | POA: Diagnosis not present
